# Patient Record
Sex: Female | Born: 1935
Health system: Southern US, Community
[De-identification: ages and names within clinical notes are randomized; demographics above are authoritative.]

## PROBLEM LIST (undated history)

## (undated) DIAGNOSIS — E119 Type 2 diabetes mellitus without complications: Secondary | ICD-10-CM

## (undated) DIAGNOSIS — E039 Hypothyroidism, unspecified: Secondary | ICD-10-CM

## (undated) DIAGNOSIS — I10 Essential (primary) hypertension: Secondary | ICD-10-CM

## (undated) DIAGNOSIS — E785 Hyperlipidemia, unspecified: Secondary | ICD-10-CM

## (undated) HISTORY — PX: TONSILLECTOMY: SUR1361

## (undated) HISTORY — PX: CATARACT EXTRACTION W/ INTRAOCULAR LENS IMPLANT: SHX1309

## (undated) HISTORY — PX: OTHER SURGICAL HISTORY: SHX169

## (undated) HISTORY — DX: Hypothyroidism, unspecified: E03.9

## (undated) HISTORY — DX: Hyperlipidemia, unspecified: E78.5

## (undated) HISTORY — DX: Essential (primary) hypertension: I10

## (undated) HISTORY — PX: ANKLE FRACTURE SURGERY: SHX122

## (undated) HISTORY — PX: THYROIDECTOMY: SHX17

## (undated) HISTORY — PX: BREAST LUMPECTOMY: SHX2

## (undated) HISTORY — DX: Type 2 diabetes mellitus without complications: E11.9

---

## 1996-12-15 HISTORY — PX: CHOLECYSTECTOMY: SHX55

## 1999-01-18 ENCOUNTER — Ambulatory Visit (HOSPITAL_COMMUNITY): Admission: RE | Admit: 1999-01-18 | Discharge: 1999-01-18 | Payer: Self-pay | Admitting: Obstetrics & Gynecology

## 1999-02-08 ENCOUNTER — Ambulatory Visit (HOSPITAL_COMMUNITY): Admission: RE | Admit: 1999-02-08 | Discharge: 1999-02-08 | Payer: Self-pay | Admitting: Unknown Physician Specialty

## 2000-01-10 ENCOUNTER — Ambulatory Visit (HOSPITAL_COMMUNITY): Admission: RE | Admit: 2000-01-10 | Discharge: 2000-01-10 | Payer: Self-pay | Admitting: *Deleted

## 2000-02-11 ENCOUNTER — Ambulatory Visit (HOSPITAL_COMMUNITY): Admission: RE | Admit: 2000-02-11 | Discharge: 2000-02-11 | Payer: Self-pay

## 2001-01-15 ENCOUNTER — Ambulatory Visit (HOSPITAL_COMMUNITY): Admission: RE | Admit: 2001-01-15 | Discharge: 2001-01-15 | Payer: Self-pay | Admitting: *Deleted

## 2001-02-11 ENCOUNTER — Ambulatory Visit (HOSPITAL_COMMUNITY): Admission: RE | Admit: 2001-02-11 | Discharge: 2001-02-11 | Payer: Self-pay | Admitting: Internal Medicine

## 2002-02-18 ENCOUNTER — Ambulatory Visit (HOSPITAL_COMMUNITY): Admission: RE | Admit: 2002-02-18 | Discharge: 2002-02-18 | Payer: Self-pay

## 2002-12-15 DIAGNOSIS — A4902 Methicillin resistant Staphylococcus aureus infection, unspecified site: Secondary | ICD-10-CM

## 2002-12-15 HISTORY — DX: Methicillin resistant Staphylococcus aureus infection, unspecified site: A49.02

## 2003-02-17 ENCOUNTER — Ambulatory Visit (HOSPITAL_COMMUNITY): Admission: RE | Admit: 2003-02-17 | Discharge: 2003-02-17 | Payer: Self-pay | Admitting: Unknown Physician Specialty

## 2003-04-05 ENCOUNTER — Ambulatory Visit (HOSPITAL_COMMUNITY): Admission: RE | Admit: 2003-04-05 | Discharge: 2003-04-05 | Payer: Self-pay | Admitting: Unknown Physician Specialty

## 2004-04-05 ENCOUNTER — Ambulatory Visit (HOSPITAL_COMMUNITY): Admission: RE | Admit: 2004-04-05 | Discharge: 2004-04-05 | Payer: Self-pay | Admitting: Unknown Physician Specialty

## 2005-04-08 ENCOUNTER — Ambulatory Visit: Payer: Self-pay | Admitting: Infectious Diseases

## 2005-04-10 ENCOUNTER — Ambulatory Visit (HOSPITAL_COMMUNITY): Admission: RE | Admit: 2005-04-10 | Discharge: 2005-04-10 | Payer: Self-pay | Admitting: Unknown Physician Specialty

## 2006-04-13 ENCOUNTER — Ambulatory Visit (HOSPITAL_COMMUNITY): Admission: RE | Admit: 2006-04-13 | Discharge: 2006-04-13 | Payer: Self-pay | Admitting: Unknown Physician Specialty

## 2006-05-14 ENCOUNTER — Encounter: Admission: RE | Admit: 2006-05-14 | Discharge: 2006-05-14 | Payer: Self-pay | Admitting: Unknown Physician Specialty

## 2007-05-19 ENCOUNTER — Encounter: Admission: RE | Admit: 2007-05-19 | Discharge: 2007-05-19 | Payer: Self-pay | Admitting: Unknown Physician Specialty

## 2007-10-27 ENCOUNTER — Encounter (INDEPENDENT_AMBULATORY_CARE_PROVIDER_SITE_OTHER): Payer: Self-pay | Admitting: Interventional Radiology

## 2007-10-27 ENCOUNTER — Other Ambulatory Visit: Admission: RE | Admit: 2007-10-27 | Discharge: 2007-10-27 | Payer: Self-pay | Admitting: Interventional Radiology

## 2007-10-27 ENCOUNTER — Encounter: Admission: RE | Admit: 2007-10-27 | Discharge: 2007-10-27 | Payer: Self-pay | Admitting: Endocrinology

## 2008-01-26 ENCOUNTER — Encounter: Admission: RE | Admit: 2008-01-26 | Discharge: 2008-01-26 | Payer: Self-pay | Admitting: Unknown Physician Specialty

## 2008-02-09 ENCOUNTER — Encounter: Admission: RE | Admit: 2008-02-09 | Discharge: 2008-02-09 | Payer: Self-pay | Admitting: Internal Medicine

## 2008-02-16 ENCOUNTER — Encounter: Admission: RE | Admit: 2008-02-16 | Discharge: 2008-02-16 | Payer: Self-pay | Admitting: Internal Medicine

## 2008-02-16 ENCOUNTER — Encounter (INDEPENDENT_AMBULATORY_CARE_PROVIDER_SITE_OTHER): Payer: Self-pay | Admitting: Diagnostic Radiology

## 2008-05-12 ENCOUNTER — Encounter (INDEPENDENT_AMBULATORY_CARE_PROVIDER_SITE_OTHER): Payer: Self-pay | Admitting: Surgery

## 2008-05-12 ENCOUNTER — Encounter: Admission: RE | Admit: 2008-05-12 | Discharge: 2008-05-12 | Payer: Self-pay | Admitting: Surgery

## 2008-05-12 ENCOUNTER — Ambulatory Visit (HOSPITAL_COMMUNITY): Admission: RE | Admit: 2008-05-12 | Discharge: 2008-05-12 | Payer: Self-pay | Admitting: Surgery

## 2008-09-05 ENCOUNTER — Ambulatory Visit: Payer: Self-pay | Admitting: Oncology

## 2008-09-26 LAB — CBC WITH DIFFERENTIAL (CANCER CENTER ONLY)
HGB: 13.3 g/dL (ref 11.6–15.9)
LYMPH#: 3.5 10*3/uL — ABNORMAL HIGH (ref 0.9–3.3)
LYMPH%: 39.3 % (ref 14.0–48.0)
MCH: 29.3 pg (ref 26.0–34.0)
MCV: 86 fL (ref 81–101)
NEUT#: 4.3 10*3/uL (ref 1.5–6.5)
Platelets: 328 10*3/uL (ref 145–400)
RBC: 4.54 10*6/uL (ref 3.70–5.32)
RDW: 12.6 % (ref 10.5–14.6)
WBC: 8.8 10*3/uL (ref 3.9–10.0)

## 2008-09-26 LAB — CMP (CANCER CENTER ONLY)
AST: 28 U/L (ref 11–38)
CO2: 31 mEq/L (ref 18–33)
Calcium: 9.4 mg/dL (ref 8.0–10.3)
Chloride: 101 mEq/L (ref 98–108)
Potassium: 4 mEq/L (ref 3.3–4.7)
Sodium: 143 mEq/L (ref 128–145)
Total Protein: 7.7 g/dL (ref 6.4–8.1)

## 2008-09-27 LAB — VITAMIN D 25 HYDROXY (VIT D DEFICIENCY, FRACTURES): Vit D, 25-Hydroxy: 25 ng/mL — ABNORMAL LOW (ref 30–89)

## 2008-10-25 ENCOUNTER — Ambulatory Visit: Payer: Self-pay | Admitting: Oncology

## 2008-10-30 LAB — CBC WITH DIFFERENTIAL (CANCER CENTER ONLY)
BASO#: 0.2 10*3/uL (ref 0.0–0.2)
BASO%: 1.4 % (ref 0.0–2.0)
EOS%: 2.7 % (ref 0.0–7.0)
Eosinophils Absolute: 0.3 10*3/uL (ref 0.0–0.5)
HCT: 38.6 % (ref 34.8–46.6)
LYMPH%: 38.2 % (ref 14.0–48.0)
MONO#: 0.9 10*3/uL (ref 0.1–0.9)
RBC: 4.48 10*6/uL (ref 3.70–5.32)
RDW: 12 % (ref 10.5–14.6)

## 2008-10-30 LAB — CMP (CANCER CENTER ONLY)
ALT(SGPT): 26 U/L (ref 10–47)
AST: 25 U/L (ref 11–38)
Albumin: 3.8 g/dL (ref 3.3–5.5)
BUN, Bld: 14 mg/dL (ref 7–22)
Chloride: 106 mEq/L (ref 98–108)
Sodium: 140 mEq/L (ref 128–145)
Total Protein: 7.4 g/dL (ref 6.4–8.1)

## 2009-04-26 ENCOUNTER — Ambulatory Visit: Payer: Self-pay | Admitting: Oncology

## 2009-12-15 HISTORY — PX: COLONOSCOPY: SHX174

## 2010-07-11 ENCOUNTER — Other Ambulatory Visit: Admission: RE | Admit: 2010-07-11 | Discharge: 2010-07-11 | Payer: Self-pay | Admitting: Otolaryngology

## 2010-08-21 ENCOUNTER — Encounter (INDEPENDENT_AMBULATORY_CARE_PROVIDER_SITE_OTHER): Payer: Self-pay | Admitting: Otolaryngology

## 2010-08-21 ENCOUNTER — Ambulatory Visit (HOSPITAL_COMMUNITY): Admission: RE | Admit: 2010-08-21 | Discharge: 2010-08-22 | Payer: Self-pay | Admitting: Otolaryngology

## 2011-01-05 ENCOUNTER — Encounter: Payer: Self-pay | Admitting: Internal Medicine

## 2011-01-05 ENCOUNTER — Encounter: Payer: Self-pay | Admitting: Unknown Physician Specialty

## 2011-02-27 LAB — CBC
HCT: 39.3 % (ref 36.0–46.0)
MCH: 29.1 pg (ref 26.0–34.0)
MCHC: 33.6 g/dL (ref 30.0–36.0)
RBC: 4.53 MIL/uL (ref 3.87–5.11)

## 2011-02-27 LAB — GLUCOSE, CAPILLARY
Glucose-Capillary: 117 mg/dL — ABNORMAL HIGH (ref 70–99)
Glucose-Capillary: 144 mg/dL — ABNORMAL HIGH (ref 70–99)

## 2011-02-27 LAB — COMPREHENSIVE METABOLIC PANEL
ALT: 24 U/L (ref 0–35)
AST: 25 U/L (ref 0–37)
Albumin: 4.3 g/dL (ref 3.5–5.2)
Alkaline Phosphatase: 95 U/L (ref 39–117)
Creatinine, Ser: 0.66 mg/dL (ref 0.4–1.2)
GFR calc Af Amer: >60 mL/min (ref >60)
Sodium: 140 mEq/L (ref 135–145)
Total Bilirubin: 1.1 mg/dL (ref 0.3–1.2)
Total Protein: 7.3 g/dL (ref 6.0–8.3)

## 2011-02-27 LAB — SURGICAL PCR SCREEN: MRSA, PCR: NEGATIVE

## 2011-04-29 NOTE — Op Note (Signed)
NAME:  [name], [name]               ACCOUNT NO.:  [id number]   MEDICAL RECORD NO.:  [id number]          PATIENT TYPE:  AMB   LOCATION:  SDS                          FACILITY:  [facility]   PHYSICIAN:  Thomas A. Cornett, M.D.DATE OF BIRTH:  1936/05/07   DATE OF PROCEDURE:  05/12/2008  DATE OF DISCHARGE:                               OPERATIVE REPORT   PREOPERATIVE DIAGNOSIS:  Right breast mass.   POSTOPERATIVE DIAGNOSIS:  Right breast mass.   PROCEDURE:  Right breast needle-localized lumpectomy.   SURGEON:  Maisie Fus A. Cornett, MD   ANESTHESIA:  LMA with 0.25% Sensorcaine.   ESTIMATED BLOOD LOSS:  20 mL.   SPECIMEN:  Right breast tissue with localizing wire and clip, verified  to be adequate by radiography to pathology.   DRAINS:  None.   INDICATIONS FOR PROCEDURE:  The patient is a 75 year old female found to  have an abnormality noted on her most recent mammogram involving her  right breast.  The density was biopsied, proven to be benign, but  unfortunately, this is not corresponded to the images and excisional  biopsy is recommended to further evaluate this.  She presents today for  definitive diagnosis.  Informed consent was obtained.   DESCRIPTION OF PROCEDURE:  The patient was brought to the operating room  after undergoing right breast needle localization by the radiologist.  After induction of LMA anesthesia, the right breast was prepped and  draped in a sterile fashion.  The wire exited the right lower outer  breast.  A curvilinear incision was made around the wire.  We excised  all tissue around the entire length of the wire down to the chest wall  to incorporate the abnormality.  Radiograph revealed this to be  adequate.  Irrigation was used and hemostasis was achieved with cautery.  No evidence of any bleeding at this point.  The wound was closed in  layers with a deep layer of 3-0 Vicryl and 4-0 Monocryl subcuticular  stitch.  Dermabond was applied.  All final counts  of sponge, needle, and  instruments were found be correct at this portion of the case.  The  patient was then awoke and taken to recovery in satisfactory condition.     Thomas A. Cornett, M.D.  Electronically Signed    TAC/MEDQ  D:  05/12/2008  T:  05/13/2008  Job:  [id number]   cc:   Dr. Sherril Croon

## 2011-09-10 LAB — COMPREHENSIVE METABOLIC PANEL
CO2: 29
Calcium: 9.4
Creatinine, Ser: 0.63
GFR calc non Af Amer: >60
Glucose, Bld: 154 — ABNORMAL HIGH
Total Protein: 7.4

## 2011-09-10 LAB — DIFFERENTIAL
Lymphocytes Relative: 38
Lymphs Abs: 4
Monocytes Relative: 10
Neutro Abs: 5.1
Neutrophils Relative %: 48

## 2011-09-10 LAB — CBC
Platelets: 314
RBC: 4.46
WBC: 10.5

## 2013-05-24 ENCOUNTER — Other Ambulatory Visit: Payer: Self-pay | Admitting: Otolaryngology

## 2013-05-24 DIAGNOSIS — D34 Benign neoplasm of thyroid gland: Secondary | ICD-10-CM

## 2013-05-26 ENCOUNTER — Ambulatory Visit
Admission: RE | Admit: 2013-05-26 | Discharge: 2013-05-26 | Disposition: A | Discharge status: Home / Self Care | Payer: Medicare Other | Source: Ambulatory Visit | Attending: Otolaryngology | Admitting: Otolaryngology

## 2013-05-26 DIAGNOSIS — D34 Benign neoplasm of thyroid gland: Secondary | ICD-10-CM

## 2016-03-17 DIAGNOSIS — N894 Leukoplakia of vagina: Secondary | ICD-10-CM | POA: Diagnosis not present

## 2016-03-17 DIAGNOSIS — Z789 Other specified health status: Secondary | ICD-10-CM | POA: Diagnosis not present

## 2016-03-17 DIAGNOSIS — Z299 Encounter for prophylactic measures, unspecified: Secondary | ICD-10-CM | POA: Diagnosis not present

## 2016-06-26 DIAGNOSIS — M65841 Other synovitis and tenosynovitis, right hand: Secondary | ICD-10-CM | POA: Diagnosis not present

## 2016-06-26 DIAGNOSIS — M65842 Other synovitis and tenosynovitis, left hand: Secondary | ICD-10-CM | POA: Diagnosis not present

## 2016-11-18 DIAGNOSIS — M5442 Lumbago with sciatica, left side: Secondary | ICD-10-CM | POA: Diagnosis not present

## 2016-11-18 DIAGNOSIS — M9903 Segmental and somatic dysfunction of lumbar region: Secondary | ICD-10-CM | POA: Diagnosis not present

## 2016-11-18 DIAGNOSIS — M47816 Spondylosis without myelopathy or radiculopathy, lumbar region: Secondary | ICD-10-CM | POA: Diagnosis not present

## 2016-12-05 DIAGNOSIS — E039 Hypothyroidism, unspecified: Secondary | ICD-10-CM | POA: Diagnosis not present

## 2016-12-05 DIAGNOSIS — Z1211 Encounter for screening for malignant neoplasm of colon: Secondary | ICD-10-CM | POA: Diagnosis not present

## 2016-12-05 DIAGNOSIS — E119 Type 2 diabetes mellitus without complications: Secondary | ICD-10-CM | POA: Diagnosis not present

## 2016-12-05 DIAGNOSIS — Z1389 Encounter for screening for other disorder: Secondary | ICD-10-CM | POA: Diagnosis not present

## 2016-12-05 DIAGNOSIS — Z7189 Other specified counseling: Secondary | ICD-10-CM | POA: Diagnosis not present

## 2016-12-05 DIAGNOSIS — Z299 Encounter for prophylactic measures, unspecified: Secondary | ICD-10-CM | POA: Diagnosis not present

## 2016-12-05 DIAGNOSIS — Z0000 Encounter for general adult medical examination without abnormal findings: Secondary | ICD-10-CM | POA: Diagnosis not present

## 2016-12-05 DIAGNOSIS — Z79899 Other long term (current) drug therapy: Secondary | ICD-10-CM | POA: Diagnosis not present

## 2016-12-05 DIAGNOSIS — R5383 Other fatigue: Secondary | ICD-10-CM | POA: Diagnosis not present

## 2017-01-13 ENCOUNTER — Encounter: Payer: Self-pay | Admitting: Gastroenterology

## 2017-01-29 DIAGNOSIS — M5442 Lumbago with sciatica, left side: Secondary | ICD-10-CM | POA: Diagnosis not present

## 2017-01-29 DIAGNOSIS — M9903 Segmental and somatic dysfunction of lumbar region: Secondary | ICD-10-CM | POA: Diagnosis not present

## 2017-01-29 DIAGNOSIS — M47816 Spondylosis without myelopathy or radiculopathy, lumbar region: Secondary | ICD-10-CM | POA: Diagnosis not present

## 2017-02-02 DIAGNOSIS — M9903 Segmental and somatic dysfunction of lumbar region: Secondary | ICD-10-CM | POA: Diagnosis not present

## 2017-02-02 DIAGNOSIS — M47816 Spondylosis without myelopathy or radiculopathy, lumbar region: Secondary | ICD-10-CM | POA: Diagnosis not present

## 2017-02-02 DIAGNOSIS — M5442 Lumbago with sciatica, left side: Secondary | ICD-10-CM | POA: Diagnosis not present

## 2017-02-03 ENCOUNTER — Ambulatory Visit (INDEPENDENT_AMBULATORY_CARE_PROVIDER_SITE_OTHER): Payer: Medicare Other | Admitting: Gastroenterology

## 2017-02-03 ENCOUNTER — Other Ambulatory Visit: Payer: Self-pay

## 2017-02-03 ENCOUNTER — Encounter: Payer: Self-pay | Admitting: Gastroenterology

## 2017-02-03 ENCOUNTER — Telehealth: Payer: Self-pay

## 2017-02-03 DIAGNOSIS — R195 Other fecal abnormalities: Secondary | ICD-10-CM

## 2017-02-03 MED ORDER — PEG 3350-KCL-NA BICARB-NACL 420 G PO SOLR: 4000.0000 mL | ORAL | 0 refills | Status: DC

## 2017-02-03 NOTE — Progress Notes (Signed)
CC'D TO PCP °

## 2017-02-03 NOTE — Patient Instructions (Signed)
1. Colonoscopy as scheduled. Please see separate instructions. 

## 2017-02-03 NOTE — Progress Notes (Addendum)
REVIEWED-NO ADDITIONAL RECOMMENDATIONS.  Primary Care Physician:  Glenda Chroman, MD  Primary Gastroenterologist:  Barney Drain, MD   Chief Complaint  Patient presents with  . other    Heme pos stool    HPI:  [name] is a 81 y.o. female here At the request of her PCP for further evaluation of Hemoccult-positive stool. Patient has a history of remote colonoscopy around 2011 according to PCP records. She had polyps and diverticulosis. Records unavailable to me. Recently she noted that she was having increasing drowsiness. She will follow sleeping she stopped. Sleeping up to 18 hours a day. When she was awake she felt fine with plenty of energy. This prompted visit to see her PCP. Describes herself as a very active individual and this is completely normal for her. Denies dyspnea on exertion, chest pain, appetite concerns, bowel issues, abdominal pain, vomiting, anorexia, unintentional weight loss no heartburn dysphagia. As part of her workup she was heme positive. Her hemoglobin was 13. Creatinine normal. Electrolytes normal. She is a diet-controlled diabetic.   Patient states that she feels fine, she just can't stay awake.  Current Outpatient Prescriptions  Medication Sig Dispense Refill  . levothyroxine (SYNTHROID, LEVOTHROID) 75 MCG tablet Take 75 mcg by mouth daily before breakfast.    . NON FORMULARY Vitamin D3  50000 units weekly     No current facility-administered medications for this visit.     Allergies as of 02/03/2017 - Review Complete 02/03/2017  Allergen Reaction Noted  . Motrin [ibuprofen] Shortness Of Breath and Swelling 02/03/2017  . Penicillins Anaphylaxis 02/03/2017  . Ciprofloxacin Rash 02/03/2017  . Naproxen Itching and Rash 02/03/2017  . Septra [sulfamethoxazole-trimethoprim] Rash 02/03/2017    Past Medical History:  Diagnosis Date  . Diabetes ([place])    diet controlled  . Hyperlipidemia   . Hypertension   . Hypothyroidism     Past Surgical History:   Procedure Laterality Date  . ANKLE FRACTURE SURGERY     right  . bladder tack    . BREAST LUMPECTOMY     benign  . carpel tunnel     bilateral  . CATARACT EXTRACTION W/ INTRAOCULAR LENS IMPLANT    . CHOLECYSTECTOMY  1998  . COLONOSCOPY  2011   According to PCP office note, history of polyps and diverticulosis  . THYROIDECTOMY    . TONSILLECTOMY      Family History  Problem Relation Age of Onset  . Leukemia Father   . Myasthenia gravis Father   . Breast cancer Maternal Grandmother   . Lung cancer Paternal Grandfather   . Lung cancer Paternal Aunt   . Cancer - Lung Paternal Aunt   . Uterine cancer Maternal Aunt   . Pancreatic cancer Maternal Aunt   . Colon cancer Neg Hx     Social History   Social History  . Marital status: Divorced    Spouse name: N/A  . Number of children: N/A  . Years of education: N/A   Occupational History  . Not on file.   Social History Main Topics  . Smoking status: Former Research scientist (life sciences)  . Smokeless tobacco: Never Used     Comment: Quit x 50 years  . Alcohol use Not on file  . Drug use: Unknown  . Sexual activity: Not on file   Other Topics Concern  . Not on file   Social History Narrative  . No narrative on file      ROS:  General: Negative for anorexia, weight loss,  fever, chills, weakness.See history of present illness Eyes: Negative for vision changes.  ENT: Negative for hoarseness, difficulty swallowing , nasal congestion. CV: Negative for chest pain, angina, palpitations, dyspnea on exertion, peripheral edema.  Respiratory: Negative for dyspnea at rest, dyspnea on exertion, cough, sputum, wheezing.  GI: See history of present illness. GU:  Negative for dysuria, hematuria, urinary incontinence, urinary frequency, nocturnal urination.  MS: Negative for joint pain, low back pain.  Derm: Negative for rash or itching.  Neuro: Negative for weakness, abnormal sensation, seizure, frequent headaches, memory loss, confusion.  Psych:  Negative for anxiety, depression, suicidal ideation, hallucinations.  Endo: Negative for unusual weight change.  Heme: Negative for bruising or bleeding. Allergy: Negative for rash or hives.    Physical Examination:  BP (!) 155/76   Pulse 74   Temp 98.1 F (36.7 C) (Oral)   Ht 5\' 2"  (1.575 m)   Wt 169 lb 12.8 oz (77 kg)   BMI 31.06 kg/m    General: Well-nourished, well-developed in no acute distress.  Head: Normocephalic, atraumatic.   Eyes: Conjunctiva pink, no icterus. Mouth: Oropharyngeal mucosa moist and pink , no lesions erythema or exudate. Neck: Supple without thyromegaly, masses, or lymphadenopathy.  Lungs: Clear to auscultation bilaterally.  Heart: Regular rate and rhythm, no murmurs rubs or gallops.  Abdomen: Bowel sounds are normal, nontender, nondistended, no hepatosplenomegaly or masses, no abdominal bruits or    hernia , no rebound or guarding.   Rectal: Not performed Extremities: No lower extremity edema. No clubbing or deformities.  Neuro: Alert and oriented x 4 , grossly normal neurologically.  Skin: Warm and dry, no rash or jaundice.   Psych: Alert and cooperative, normal mood and affect.  Labs: Labs from 12/07/2016: TSH 4.13, BUN 10, creatinine 0.62, glucose 131, total bilirubin 0.9, alkaline phosphatase 83, AST 32, ALT 37, albumin 4.4, white blood cell count 10,300, hemoglobin 13, hematocrit 38.1, MCV 87, platelets 378,000  Imaging Studies: No results found.

## 2017-02-03 NOTE — Assessment & Plan Note (Signed)
81 year old female with heme positive stools presenting for further workup per PCP. Patient recently with new onset fatigue without other symptoms. Hemoglobin normal 2 months ago. Last colonoscopy 2011. No upper GI symptoms. Offered colonoscopy for further evaluation of heme positive stool. I have discussed the risks, alternatives, benefits with regards to but not limited to the risk of reaction to medication, bleeding, infection, perforation and the patient is agreeable to proceed. Written consent to be obtained.

## 2017-02-03 NOTE — Telephone Encounter (Signed)
Called pt and scheduled colonoscopy with [facility] for 02/13/17 at 2:30pm, arrive at 1:30pm. Instructions given on phone and mailed. Rx sent for prep.

## 2017-02-05 DIAGNOSIS — M9903 Segmental and somatic dysfunction of lumbar region: Secondary | ICD-10-CM | POA: Diagnosis not present

## 2017-02-05 DIAGNOSIS — M5442 Lumbago with sciatica, left side: Secondary | ICD-10-CM | POA: Diagnosis not present

## 2017-02-05 DIAGNOSIS — M47816 Spondylosis without myelopathy or radiculopathy, lumbar region: Secondary | ICD-10-CM | POA: Diagnosis not present

## 2017-02-09 DIAGNOSIS — M9903 Segmental and somatic dysfunction of lumbar region: Secondary | ICD-10-CM | POA: Diagnosis not present

## 2017-02-09 DIAGNOSIS — M5442 Lumbago with sciatica, left side: Secondary | ICD-10-CM | POA: Diagnosis not present

## 2017-02-09 DIAGNOSIS — M47816 Spondylosis without myelopathy or radiculopathy, lumbar region: Secondary | ICD-10-CM | POA: Diagnosis not present

## 2017-02-13 ENCOUNTER — Ambulatory Visit (HOSPITAL_COMMUNITY)
Admission: RE | Admit: 2017-02-13 | Discharge: 2017-02-13 | Disposition: A | Discharge status: Home / Self Care | Payer: Medicare Other | Source: Ambulatory Visit | Attending: Gastroenterology | Admitting: Gastroenterology

## 2017-02-13 ENCOUNTER — Encounter (HOSPITAL_COMMUNITY)
Admission: RE | Disposition: A | Discharge status: Home / Self Care | Payer: Self-pay | Source: Ambulatory Visit | Attending: Gastroenterology

## 2017-02-13 ENCOUNTER — Encounter (HOSPITAL_COMMUNITY): Payer: Self-pay | Admitting: *Deleted

## 2017-02-13 DIAGNOSIS — E119 Type 2 diabetes mellitus without complications: Secondary | ICD-10-CM | POA: Diagnosis not present

## 2017-02-13 DIAGNOSIS — E785 Hyperlipidemia, unspecified: Secondary | ICD-10-CM | POA: Insufficient documentation

## 2017-02-13 DIAGNOSIS — E039 Hypothyroidism, unspecified: Secondary | ICD-10-CM | POA: Diagnosis not present

## 2017-02-13 DIAGNOSIS — K644 Residual hemorrhoidal skin tags: Secondary | ICD-10-CM | POA: Insufficient documentation

## 2017-02-13 DIAGNOSIS — K5730 Diverticulosis of large intestine without perforation or abscess without bleeding: Secondary | ICD-10-CM

## 2017-02-13 DIAGNOSIS — R195 Other fecal abnormalities: Secondary | ICD-10-CM | POA: Diagnosis not present

## 2017-02-13 DIAGNOSIS — Z79899 Other long term (current) drug therapy: Secondary | ICD-10-CM | POA: Diagnosis not present

## 2017-02-13 DIAGNOSIS — K648 Other hemorrhoids: Secondary | ICD-10-CM | POA: Diagnosis not present

## 2017-02-13 DIAGNOSIS — I10 Essential (primary) hypertension: Secondary | ICD-10-CM | POA: Diagnosis not present

## 2017-02-13 DIAGNOSIS — Z87891 Personal history of nicotine dependence: Secondary | ICD-10-CM | POA: Insufficient documentation

## 2017-02-13 DIAGNOSIS — K5520 Angiodysplasia of colon without hemorrhage: Secondary | ICD-10-CM

## 2017-02-13 HISTORY — PX: COLONOSCOPY: SHX5424

## 2017-02-13 LAB — GLUCOSE, CAPILLARY: GLUCOSE-CAPILLARY: 115 mg/dL — AB (ref 65–99)

## 2017-02-13 SURGERY — COLONOSCOPY
Anesthesia: Moderate Sedation

## 2017-02-13 MED ORDER — STERILE WATER FOR IRRIGATION IR SOLN
Status: DC | PRN
  Administered 2017-02-13: 11:00:00

## 2017-02-13 MED ORDER — MEPERIDINE HCL 100 MG/ML IJ SOLN
INTRAMUSCULAR | Status: AC
  Filled 2017-02-13: qty 2

## 2017-02-13 MED ORDER — MEPERIDINE HCL 100 MG/ML IJ SOLN
INTRAMUSCULAR | Status: DC | PRN
  Administered 2017-02-13 (×2): 25 mg via INTRAVENOUS

## 2017-02-13 MED ORDER — MIDAZOLAM HCL 5 MG/5ML IJ SOLN
INTRAMUSCULAR | Status: AC
  Filled 2017-02-13: qty 10

## 2017-02-13 MED ORDER — SODIUM CHLORIDE 0.9 % IV SOLN
INTRAVENOUS | Status: DC
  Administered 2017-02-13: 10:00:00 via INTRAVENOUS

## 2017-02-13 MED ORDER — MIDAZOLAM HCL 5 MG/5ML IJ SOLN
INTRAMUSCULAR | Status: DC | PRN
  Administered 2017-02-13 (×2): 2 mg via INTRAVENOUS

## 2017-02-13 NOTE — Discharge Instructions (Signed)
The blood detected IN YOUR STOOL IS DUE TO AVMS AND HEMORRHOIDS. I CAUTERIZED ONE AVM AND MADE IT DISAPPEAR. You DID NOT HAVE ANY POLYPS. YOU HAVE DIVERTICULOSIS IN YOUR LEFT COLON. You ALSO have external hemorrhoids.     DRINK WATER TO KEEP URINE LIGHT YELLOW.  FOLLOW A HIGH FIBER DIET. AVOID ITEMS THAT CAUSE BLOATING & GAS. SEE INFO BELOW.  FOLLOW UP IN 4 MOS.         ENDOSCOPY Care After Read the instructions outlined below and refer to this sheet in the next week. These discharge instructions provide you with general information on caring for yourself after you leave the hospital. While your treatment has been planned according to the most current medical practices available, unavoidable complications occasionally occur. If you have any problems or questions after discharge, call DR. Makenze Ellett, [phone].  ACTIVITY  You may resume your regular activity, but move at a slower pace for the next 24 hours.   Take frequent rest periods for the next 24 hours.   Walking will help get rid of the air and reduce the bloated feeling in your belly (abdomen).   No driving for 24 hours (because of the medicine (anesthesia) used during the test).   You may shower.   Do not sign any important legal documents or operate any machinery for 24 hours (because of the anesthesia used during the test).    NUTRITION  Drink plenty of fluids.   You may resume your normal diet as instructed by your doctor.   Begin with a light meal and progress to your normal diet. Heavy or fried foods are harder to digest and may make you feel sick to your stomach (nauseated).   Avoid alcoholic beverages for 24 hours or as instructed.    MEDICATIONS  You may resume your normal medications.   WHAT YOU CAN EXPECT TODAY  Some feelings of bloating in the abdomen.   Passage of more gas than usual.   Spotting of blood in your stool or on the toilet paper  .  IF YOU HAD POLYPS REMOVED DURING THE  COLONOSCOPY:  Eat a soft diet IF YOU HAVE NAUSEA, BLOATING, ABDOMINAL PAIN, OR VOMITING.    FINDING OUT THE RESULTS OF YOUR TEST Not all test results are available during your visit. DR. Oneida Alar WILL CALL YOU WITHIN 7 DAYS OF YOUR PROCEDUE WITH YOUR RESULTS. Do not assume everything is normal if you have not heard from DR. Whitt Auletta IN ONE WEEK, CALL HER OFFICE AT [phone].  SEEK IMMEDIATE MEDICAL ATTENTION AND CALL THE OFFICE: [phone] IF:  You have more than a spotting of blood in your stool.   Your belly is swollen (abdominal distention).   You are nauseated or vomiting.   You have a temperature over 101F.   You have abdominal pain or discomfort that is severe or gets worse throughout the day.  Arteriovenous Malformation An arteriovenous malformation (AVM) is a disorder that has been present since birth (congenital). It is characterized by a complex, tangled web of arteries and veins. An AVM may occur in the STOMACH, COLON, OR SMALL BOWEL.  SYMPTOMS  The most common problems (symptoms) of AVM include:  Bleeding (hemorrhaging).   ANEMIA  TREATMENT  The treatment for AVMs: **ABLATION WITH HEAT   High-Fiber Diet A high-fiber diet changes your normal diet to include more whole grains, legumes, fruits, and vegetables. Changes in the diet involve replacing refined carbohydrates with unrefined foods. The calorie level of the diet is essentially  unchanged. The Dietary Reference Intake (recommended amount) for adult males is 38 grams per day. For adult females, it is 25 grams per day. Pregnant and lactating women should consume 28 grams of fiber per day. Fiber is the intact part of a plant that is not broken down during digestion. Functional fiber is fiber that has been isolated from the plant to provide a beneficial effect in the body. PURPOSE  Increase stool bulk.   Ease and regulate bowel movements.   Lower cholesterol.   REDUCE RISK OF COLON CANCER  INDICATIONS THAT  YOU NEED MORE FIBER  Constipation and hemorrhoids.   Uncomplicated diverticulosis (intestine condition) and irritable bowel syndrome.   Weight management.   As a protective measure against hardening of the arteries (atherosclerosis), diabetes, and cancer.   GUIDELINES FOR INCREASING FIBER IN THE DIET  Start adding fiber to the diet slowly. A gradual increase of about 5 more grams (2 slices of whole-wheat bread, 2 servings of most fruits or vegetables, or 1 bowl of high-fiber cereal) per day is best. Too rapid an increase in fiber may result in constipation, flatulence, and bloating.   Drink enough water and fluids to keep your urine clear or pale yellow. Water, juice, or caffeine-free drinks are recommended. Not drinking enough fluid may cause constipation.   Eat a variety of high-fiber foods rather than one type of fiber.   Try to increase your intake of fiber through using high-fiber foods rather than fiber pills or supplements that contain small amounts of fiber.   The goal is to change the types of food eaten. Do not supplement your present diet with high-fiber foods, but replace foods in your present diet.   INCLUDE A VARIETY OF FIBER SOURCES  Replace refined and processed grains with whole grains, canned fruits with fresh fruits, and incorporate other fiber sources. White rice, white breads, and most bakery goods contain little or no fiber.   Brown whole-grain rice, buckwheat oats, and many fruits and vegetables are all good sources of fiber. These include: broccoli, Brussels sprouts, cabbage, cauliflower, beets, sweet potatoes, white potatoes (skin on), carrots, tomatoes, eggplant, squash, berries, fresh fruits, and dried fruits.   Cereals appear to be the richest source of fiber. Cereal fiber is found in whole grains and bran. Bran is the fiber-rich outer coat of cereal grain, which is largely removed in refining. In whole-grain cereals, the bran remains. In breakfast cereals, the  largest amount of fiber is found in those with "bran" in their names. The fiber content is sometimes indicated on the label.   You may need to include additional fruits and vegetables each day.   In baking, for 1 cup white flour, you may use the following substitutions:   1 cup whole-wheat flour minus 2 tablespoons.   1/2 cup white flour plus 1/2 cup whole-wheat flour.   Diverticulosis Diverticulosis is a common condition that develops when small pouches (diverticula) form in the wall of the colon. The risk of diverticulosis increases with age. It happens more often in people who eat a low-fiber diet. Most individuals with diverticulosis have no symptoms. Those individuals with symptoms usually experience belly (abdominal) pain, constipation, or loose stools (diarrhea).  HOME CARE INSTRUCTIONS  Increase the amount of fiber in your diet as directed by your caregiver or dietician. This may reduce symptoms of diverticulosis.   Drink at least 6 to 8 glasses of water each day to prevent constipation.   Try not to strain when you have a  bowel movement.   THERE IS NO NEED TO Avoid nuts and seeds to prevent complications.   FOODS HAVING HIGH FIBER CONTENT INCLUDE:  Fruits. Apple, peach, pear, tangerine, raisins, prunes.   Vegetables. Brussels sprouts, asparagus, broccoli, cabbage, carrot, cauliflower, romaine lettuce, spinach, summer squash, tomato, winter squash, zucchini.   Starchy Vegetables. Baked beans, kidney beans, lima beans, split peas, lentils, potatoes (with skin).   Grains. Whole wheat bread, brown rice, bran flake cereal, plain oatmeal, white rice, shredded wheat, bran muffins.

## 2017-02-13 NOTE — Op Note (Signed)
[facility] Patient Name: [name] Procedure Date: 02/13/2017 11:02 AM MRN: [id number] Date of Birth: 1936/09/13 Attending MD: Barney Drain , MD CSN: [id number] Age: 81 Admit Type: Outpatient Procedure:                Colonoscopy WITH APC Indications:              Heme positive stool Providers:                Barney Drain, MD, Janeece Riggers, RN, Purcell Nails. [place],                            Merchant navy officer Referring MD:             Glenda Chroman Medicines:                Meperidine 50 mg IV, Midazolam 4 mg IV Complications:            No immediate complications. Estimated Blood Loss:     Estimated blood loss was minimal. Procedure:                Pre-Anesthesia Assessment:                           - Prior to the procedure, a History and Physical                            was performed, and patient medications and                            allergies were reviewed. The patient's tolerance of                            previous anesthesia was also reviewed. The risks                            and benefits of the procedure and the sedation                            options and risks were discussed with the patient.                            All questions were answered, and informed consent                            was obtained. Prior Anticoagulants: The patient has                            taken no previous anticoagulant or antiplatelet                            agents. ASA Grade Assessment: II - A patient with                            mild systemic disease. After reviewing the risks  and benefits, the patient was deemed in                            satisfactory condition to undergo the                            procedure.After obtaining informed consent, the                            colonoscope was passed under direct vision.                            Throughout the procedure, the patient's blood                            pressure, pulse, and  oxygen saturations were                            monitored continuously. The [id number]                            scope was introduced through the anus and advanced                            to the 5 cm into the ileum. The colonoscopy was                            somewhat difficult due to a tortuous colon.                            Successful completion of the procedure was aided by                            COLOWRAP. The patient tolerated the procedure well.                            The quality of the bowel preparation was excellent.                            The terminal ileum, ileocecal valve, appendiceal                            orifice, and rectum were photographed. Scope In: 11:19:13 AM Scope Out: 11:37:11 AM Scope Withdrawal Time: 0 hours 14 minutes 40 seconds  Total Procedure Duration: 0 hours 17 minutes 58 seconds  Findings:      The terminal ileum appeared normal.      Multiple small and large-mouthed diverticula were found in the       recto-sigmoid colon and sigmoid colon.      A single small angioectasia without bleeding was found in the cecum.       Coagulation for bleeding prevention using argon plasma at 0.8       liters/minute and 20 watts was successful.      External and internal hemorrhoids were found during retroflexion. Impression:               -  The examined portion of the ileum was normal.                           - Diverticulosis in the recto-sigmoid colon and in                            the sigmoid colon.                           - HEME POSITIVE STOOLS DUE TO HEMORRHOIDS AND AVMs                           - External and internal hemorrhoids. Moderate Sedation:      Moderate (conscious) sedation was administered by the endoscopy nurse       and supervised by the endoscopist. The following parameters were       monitored: oxygen saturation, heart rate, blood pressure, and response       to care. Total physician intraservice time was 30  minutes. Recommendation:           - High fiber diet.                           - Continue present medications.                           - Repeat colonoscopy 10-15 YEARS IF THE BENEFITS                            OUTWEIGH THE RISKS for surveillance. PT HAS HAD AGE                            APPROPRIATE COLON CANCER SCREENING. NO NEED TO HEME                            TEST STOOLS.                           - Return to GI office in 4 months.                           - Patient has a contact number available for                            emergencies. The signs and symptoms of potential                            delayed complications were discussed with the                            patient. Return to normal activities tomorrow.                            Written discharge instructions were provided to the  patient. Procedure Code(s):        --- Professional ---                           [phone], Colonoscopy, flexible; with control of                            bleeding, any method                           99152, Moderate sedation services provided by the                            same physician or other qualified health care                            professional performing the diagnostic or                            therapeutic service that the sedation supports,                            requiring the presence of an independent trained                            observer to assist in the monitoring of the                            patient's level of consciousness and physiological                            status; initial 15 minutes of intraservice time,                            patient age 75 years or older                           [phone], Moderate sedation services; each additional                            15 minutes intraservice time Diagnosis Code(s):        --- Professional ---                           K64.8, Other hemorrhoids                            K55.20, Angiodysplasia of colon without hemorrhage                           R19.5, Other fecal abnormalities                           K57.30, Diverticulosis of large intestine without  perforation or abscess without bleeding CPT copyright 2016 [employer]. All rights reserved. The codes documented in this report are preliminary and upon coder review may  be revised to meet current compliance requirements. Barney Drain, MD Barney Drain, MD 02/13/2017 11:54:17 AM This report has been signed electronically. Number of Addenda: 0

## 2017-02-13 NOTE — H&P (Signed)
Primary Care Physician:  Glenda Chroman, MD Primary Gastroenterologist:  Dr. Oneida Alar  Pre-Procedure History & Physical: HPI:  [name] is a 81 y.o. female here for [place].  Past Medical History:  Diagnosis Date  . Diabetes ([place])    diet controlled  . Hyperlipidemia   . Hypertension   . Hypothyroidism     Past Surgical History:  Procedure Laterality Date  . ANKLE FRACTURE SURGERY     right  . bladder tack    . BREAST LUMPECTOMY     benign  . carpel tunnel     bilateral  . CATARACT EXTRACTION W/ INTRAOCULAR LENS IMPLANT    . CHOLECYSTECTOMY  1998  . COLONOSCOPY  2011   According to PCP office note, history of polyps and diverticulosis  . THYROIDECTOMY    . TONSILLECTOMY      Prior to Admission medications   Medication Sig Start Date End Date Taking? Authorizing Provider  Ascorbic Acid (VITAMIN C) 1000 MG tablet Take 1,000 mg by mouth every evening.   Yes Historical Provider, MD  levothyroxine (SYNTHROID, LEVOTHROID) 75 MCG tablet Take 75 mcg by mouth every evening.    Yes Historical Provider, MD  Multiple Vitamin (MULTIVITAMIN WITH MINERALS) TABS tablet Take 1 tablet by mouth every evening.   Yes Historical Provider, MD  polyethylene glycol-electrolytes (TRILYTE) 420 g solution Take 4,000 mLs by mouth as directed. 02/03/17  Yes Danie Binder, MD  Vitamin D, Ergocalciferol, (DRISDOL) 50000 units CAPS capsule Take 50,000 Units by mouth every Saturday. 02/05/17  Yes Historical Provider, MD    Allergies as of 02/03/2017 - Review Complete 02/03/2017  Allergen Reaction Noted  . Motrin [ibuprofen] Shortness Of Breath and Swelling 02/03/2017  . Penicillins Anaphylaxis 02/03/2017  . Ciprofloxacin Rash 02/03/2017  . Naproxen Itching and Rash 02/03/2017  . Septra [sulfamethoxazole-trimethoprim] Rash 02/03/2017    Family History  Problem Relation Age of Onset  . Leukemia Father   . Myasthenia gravis Father   . Breast cancer Maternal Grandmother   . Lung  cancer Paternal Grandfather   . Lung cancer Paternal Aunt   . Cancer - Lung Paternal Aunt   . Uterine cancer Maternal Aunt   . Pancreatic cancer Maternal Aunt   . Heart attack Mother   . Colon cancer Neg Hx     Social History   Social History  . Marital status: Divorced    Spouse name: N/A  . Number of children: N/A  . Years of education: N/A   Occupational History  . Not on file.   Social History Main Topics  . Smoking status: Former Research scientist (life sciences)  . Smokeless tobacco: Never Used     Comment: Quit x 50 years  . Alcohol use No  . Drug use: No  . Sexual activity: Not on file   Other Topics Concern  . Not on file   Social History Narrative  . No narrative on file    Review of Systems: See HPI, otherwise negative ROS   Physical Exam: BP (!) 182/86   Pulse 79   Temp 97.9 F (36.6 C) (Oral)   Resp 15   Ht 5\' 2"  (1.575 m)   Wt 165 lb (74.8 kg)   SpO2 95%   BMI 30.18 kg/m  General:   Alert,  pleasant and cooperative in NAD Head:  Normocephalic and atraumatic. Neck:  Supple; Lungs:  Clear throughout to auscultation.    Heart:  Regular rate and rhythm. Abdomen:  Soft, nontender and  nondistended. Normal bowel sounds, without guarding, and without rebound.   Neurologic:  Alert and  oriented x4;  grossly normal neurologically.  Impression/Plan:     HEME POS STOOLS  PLAN:  1.TCS TODAY. DISCUSSED PROCEDURE, BENEFITS, & RISKS: < 1% chance of medication reaction, bleeding, perforation, or rupture of spleen/liver.

## 2017-02-20 ENCOUNTER — Encounter (HOSPITAL_COMMUNITY): Payer: Self-pay | Admitting: Gastroenterology

## 2017-03-11 DIAGNOSIS — Z2821 Immunization not carried out because of patient refusal: Secondary | ICD-10-CM | POA: Diagnosis not present

## 2017-03-11 DIAGNOSIS — E119 Type 2 diabetes mellitus without complications: Secondary | ICD-10-CM | POA: Diagnosis not present

## 2017-03-11 DIAGNOSIS — Z6831 Body mass index (BMI) 31.0-31.9, adult: Secondary | ICD-10-CM | POA: Diagnosis not present

## 2017-03-11 DIAGNOSIS — Z299 Encounter for prophylactic measures, unspecified: Secondary | ICD-10-CM | POA: Diagnosis not present

## 2017-03-11 DIAGNOSIS — J329 Chronic sinusitis, unspecified: Secondary | ICD-10-CM | POA: Diagnosis not present

## 2017-03-11 DIAGNOSIS — Z713 Dietary counseling and surveillance: Secondary | ICD-10-CM | POA: Diagnosis not present

## 2017-05-18 ENCOUNTER — Encounter: Payer: Self-pay | Admitting: Gastroenterology

## 2017-06-15 DIAGNOSIS — Z1231 Encounter for screening mammogram for malignant neoplasm of breast: Secondary | ICD-10-CM | POA: Diagnosis not present

## 2017-06-15 DIAGNOSIS — E7800 Pure hypercholesterolemia, unspecified: Secondary | ICD-10-CM | POA: Diagnosis not present

## 2017-06-15 DIAGNOSIS — E559 Vitamin D deficiency, unspecified: Secondary | ICD-10-CM | POA: Diagnosis not present

## 2017-06-15 DIAGNOSIS — E1165 Type 2 diabetes mellitus with hyperglycemia: Secondary | ICD-10-CM | POA: Diagnosis not present

## 2017-06-15 DIAGNOSIS — Z789 Other specified health status: Secondary | ICD-10-CM | POA: Diagnosis not present

## 2017-06-15 DIAGNOSIS — E039 Hypothyroidism, unspecified: Secondary | ICD-10-CM | POA: Diagnosis not present

## 2017-06-15 DIAGNOSIS — I10 Essential (primary) hypertension: Secondary | ICD-10-CM | POA: Diagnosis not present

## 2017-06-15 DIAGNOSIS — E2839 Other primary ovarian failure: Secondary | ICD-10-CM | POA: Diagnosis not present

## 2017-06-15 DIAGNOSIS — E041 Nontoxic single thyroid nodule: Secondary | ICD-10-CM | POA: Diagnosis not present

## 2017-06-15 DIAGNOSIS — Z299 Encounter for prophylactic measures, unspecified: Secondary | ICD-10-CM | POA: Diagnosis not present

## 2017-06-15 DIAGNOSIS — I6529 Occlusion and stenosis of unspecified carotid artery: Secondary | ICD-10-CM | POA: Diagnosis not present

## 2017-06-15 DIAGNOSIS — Z6831 Body mass index (BMI) 31.0-31.9, adult: Secondary | ICD-10-CM | POA: Diagnosis not present

## 2017-06-18 DIAGNOSIS — Z1231 Encounter for screening mammogram for malignant neoplasm of breast: Secondary | ICD-10-CM | POA: Diagnosis not present

## 2017-07-30 DIAGNOSIS — E2839 Other primary ovarian failure: Secondary | ICD-10-CM | POA: Diagnosis not present

## 2017-08-12 ENCOUNTER — Encounter: Payer: Self-pay | Admitting: Gastroenterology

## 2017-08-12 ENCOUNTER — Ambulatory Visit (INDEPENDENT_AMBULATORY_CARE_PROVIDER_SITE_OTHER): Payer: Medicare Other | Admitting: Gastroenterology

## 2017-08-12 VITALS — BP 160/98 | HR 65 | Temp 97.8°F | Ht 62.0 in | Wt 167.6 lb

## 2017-08-12 DIAGNOSIS — R195 Other fecal abnormalities: Secondary | ICD-10-CM | POA: Diagnosis not present

## 2017-08-12 NOTE — Progress Notes (Signed)
       Primary Care Physician: Glenda Chroman, MD  Primary Gastroenterologist:  Barney Drain, MD   Chief Complaint  Patient presents with  . Blood In Stools    f/u, doing ok    HPI: [name] is a 81 y.o. female hereFor follow-up. She was seen in the office back in February for heme positive stools. She underwent an updated colonoscopy in March. Terminal ileum was normal, multiple diverticula, single small angiectasia without bleeding in the cecum status post APC, external and internal hemorrhoids seen.  She feels well. No GI symptoms. Her excessive sleeping resolved. Blood pressure was up today. She states this usually happens in a doctor's office. She checks her blood pressure routinely at home with normal values.  Current Outpatient Prescriptions  Medication Sig Dispense Refill  . cholecalciferol (VITAMIN D) 1000 units tablet Take 1,000 Units by mouth as needed.    Marland Kitchen levothyroxine (SYNTHROID, LEVOTHROID) 75 MCG tablet Take 75 mcg by mouth every evening.     . Vitamin D, Ergocalciferol, (DRISDOL) 50000 units CAPS capsule Take 50,000 Units by mouth every Saturday.  12   No current facility-administered medications for this visit.     Allergies as of 08/12/2017 - Review Complete 08/12/2017  Allergen Reaction Noted  . Motrin [ibuprofen] Shortness Of Breath and Swelling 02/03/2017  . Penicillins Anaphylaxis 02/03/2017  . Ciprofloxacin Itching and Rash 02/03/2017  . Naproxen Itching and Rash 02/03/2017  . Septra [sulfamethoxazole-trimethoprim] Itching and Rash 02/03/2017  . Acetaminophen Other (See Comments)     ROS:  General: Negative for anorexia, weight loss, fever, chills, fatigue, weakness. ENT: Negative for hoarseness, difficulty swallowing , nasal congestion. CV: Negative for chest pain, angina, palpitations, dyspnea on exertion, peripheral edema.  Respiratory: Negative for dyspnea at rest, dyspnea on exertion, cough, sputum, wheezing.  GI: See history of  present illness. GU:  Negative for dysuria, hematuria, urinary incontinence, urinary frequency, nocturnal urination.  Endo: Negative for unusual weight change.    Physical Examination:   BP (!) 160/98 (BP Location: Left Arm, Cuff Size: Normal)   Pulse 65   Temp 97.8 F (36.6 C) (Oral)   Ht 5\' 2"  (1.575 m)   Wt 167 lb 9.6 oz (76 kg)   BMI 30.65 kg/m   General: Well-nourished, well-developed in no acute distress.  Eyes: No icterus. Mouth: Oropharyngeal mucosa moist and pink , no lesions erythema or exudate. Lungs: Clear to auscultation bilaterally.  Heart: Regular rate and rhythm, no murmurs rubs or gallops.  Abdomen: Bowel sounds are normal, nontender, nondistended, no hepatosplenomegaly or masses, no abdominal bruits or hernia , no rebound or guarding.   Extremities: No lower extremity edema. No clubbing or deformities. Neuro: Alert and oriented x 4   Skin: Warm and dry, no jaundice.   Psych: Alert and cooperative, normal mood and affect.

## 2017-08-12 NOTE — Progress Notes (Signed)
cc'ed to pcp °

## 2017-08-12 NOTE — Patient Instructions (Signed)
1. Please recheck your blood pressure at home. If remains elevated, >140/90, discuss with Dr. Woody Seller.  2. Return to office as needed.

## 2017-08-12 NOTE — Assessment & Plan Note (Signed)
Clinically doing well. Heme positive stool likely due to hemorrhoids and AVMs. Hemoglobin 6 months ago. Advised patient to follow-up with PCP. Consider periodic hemoglobin to look for anemia as she had a single AVM in her colon which was treated but she could have additional AVMs in her upper GI tract/small bowel. Clinically no evidence of anemia today.  Blood pressure initially quite elevated whitecoat syndrome. Reports normal blood pressures at home. Improved with rechecked today.

## 2017-11-14 DIAGNOSIS — H40033 Anatomical narrow angle, bilateral: Secondary | ICD-10-CM | POA: Diagnosis not present

## 2017-11-14 DIAGNOSIS — H2513 Age-related nuclear cataract, bilateral: Secondary | ICD-10-CM | POA: Diagnosis not present

## 2018-12-28 DIAGNOSIS — Z299 Encounter for prophylactic measures, unspecified: Secondary | ICD-10-CM | POA: Diagnosis not present

## 2018-12-28 DIAGNOSIS — Z6831 Body mass index (BMI) 31.0-31.9, adult: Secondary | ICD-10-CM | POA: Diagnosis not present

## 2018-12-28 DIAGNOSIS — I10 Essential (primary) hypertension: Secondary | ICD-10-CM | POA: Diagnosis not present

## 2018-12-28 DIAGNOSIS — Z87891 Personal history of nicotine dependence: Secondary | ICD-10-CM | POA: Diagnosis not present

## 2018-12-28 DIAGNOSIS — E039 Hypothyroidism, unspecified: Secondary | ICD-10-CM | POA: Diagnosis not present

## 2018-12-28 DIAGNOSIS — Z2821 Immunization not carried out because of patient refusal: Secondary | ICD-10-CM | POA: Diagnosis not present

## 2018-12-30 DIAGNOSIS — Z961 Presence of intraocular lens: Secondary | ICD-10-CM | POA: Diagnosis not present

## 2018-12-30 DIAGNOSIS — H43812 Vitreous degeneration, left eye: Secondary | ICD-10-CM | POA: Diagnosis not present

## 2018-12-30 DIAGNOSIS — E113291 Type 2 diabetes mellitus with mild nonproliferative diabetic retinopathy without macular edema, right eye: Secondary | ICD-10-CM | POA: Diagnosis not present

## 2019-05-04 DIAGNOSIS — Z299 Encounter for prophylactic measures, unspecified: Secondary | ICD-10-CM | POA: Diagnosis not present

## 2019-05-04 DIAGNOSIS — E1165 Type 2 diabetes mellitus with hyperglycemia: Secondary | ICD-10-CM | POA: Diagnosis not present

## 2019-05-04 DIAGNOSIS — Z789 Other specified health status: Secondary | ICD-10-CM | POA: Diagnosis not present

## 2019-05-04 DIAGNOSIS — Z6830 Body mass index (BMI) 30.0-30.9, adult: Secondary | ICD-10-CM | POA: Diagnosis not present

## 2019-05-04 DIAGNOSIS — L98499 Non-pressure chronic ulcer of skin of other sites with unspecified severity: Secondary | ICD-10-CM | POA: Diagnosis not present

## 2019-05-04 DIAGNOSIS — I10 Essential (primary) hypertension: Secondary | ICD-10-CM | POA: Diagnosis not present

## 2019-05-12 DIAGNOSIS — Z6830 Body mass index (BMI) 30.0-30.9, adult: Secondary | ICD-10-CM | POA: Diagnosis not present

## 2019-05-12 DIAGNOSIS — I83009 Varicose veins of unspecified lower extremity with ulcer of unspecified site: Secondary | ICD-10-CM | POA: Diagnosis not present

## 2019-05-12 DIAGNOSIS — Z299 Encounter for prophylactic measures, unspecified: Secondary | ICD-10-CM | POA: Diagnosis not present

## 2019-05-12 DIAGNOSIS — L97909 Non-pressure chronic ulcer of unspecified part of unspecified lower leg with unspecified severity: Secondary | ICD-10-CM | POA: Diagnosis not present

## 2019-05-12 DIAGNOSIS — I10 Essential (primary) hypertension: Secondary | ICD-10-CM | POA: Diagnosis not present

## 2019-05-26 DIAGNOSIS — L97909 Non-pressure chronic ulcer of unspecified part of unspecified lower leg with unspecified severity: Secondary | ICD-10-CM | POA: Diagnosis not present

## 2019-05-26 DIAGNOSIS — I10 Essential (primary) hypertension: Secondary | ICD-10-CM | POA: Diagnosis not present

## 2019-05-26 DIAGNOSIS — I83009 Varicose veins of unspecified lower extremity with ulcer of unspecified site: Secondary | ICD-10-CM | POA: Diagnosis not present

## 2019-05-26 DIAGNOSIS — Z6829 Body mass index (BMI) 29.0-29.9, adult: Secondary | ICD-10-CM | POA: Diagnosis not present

## 2019-05-26 DIAGNOSIS — Z299 Encounter for prophylactic measures, unspecified: Secondary | ICD-10-CM | POA: Diagnosis not present

## 2019-05-26 DIAGNOSIS — L98499 Non-pressure chronic ulcer of skin of other sites with unspecified severity: Secondary | ICD-10-CM | POA: Diagnosis not present

## 2019-06-15 DIAGNOSIS — R5383 Other fatigue: Secondary | ICD-10-CM | POA: Diagnosis not present

## 2019-06-15 DIAGNOSIS — E039 Hypothyroidism, unspecified: Secondary | ICD-10-CM | POA: Diagnosis not present

## 2019-06-15 DIAGNOSIS — Z0000 Encounter for general adult medical examination without abnormal findings: Secondary | ICD-10-CM | POA: Diagnosis not present

## 2019-06-15 DIAGNOSIS — Z7189 Other specified counseling: Secondary | ICD-10-CM | POA: Diagnosis not present

## 2019-06-15 DIAGNOSIS — Z6829 Body mass index (BMI) 29.0-29.9, adult: Secondary | ICD-10-CM | POA: Diagnosis not present

## 2019-06-15 DIAGNOSIS — Z299 Encounter for prophylactic measures, unspecified: Secondary | ICD-10-CM | POA: Diagnosis not present

## 2019-06-15 DIAGNOSIS — E1165 Type 2 diabetes mellitus with hyperglycemia: Secondary | ICD-10-CM | POA: Diagnosis not present

## 2019-06-15 DIAGNOSIS — E785 Hyperlipidemia, unspecified: Secondary | ICD-10-CM | POA: Diagnosis not present

## 2019-06-15 DIAGNOSIS — I10 Essential (primary) hypertension: Secondary | ICD-10-CM | POA: Diagnosis not present

## 2019-06-15 DIAGNOSIS — Z79899 Other long term (current) drug therapy: Secondary | ICD-10-CM | POA: Diagnosis not present

## 2019-06-15 DIAGNOSIS — Z1331 Encounter for screening for depression: Secondary | ICD-10-CM | POA: Diagnosis not present

## 2019-06-15 DIAGNOSIS — Z1339 Encounter for screening examination for other mental health and behavioral disorders: Secondary | ICD-10-CM | POA: Diagnosis not present

## 2019-06-15 DIAGNOSIS — Z1211 Encounter for screening for malignant neoplasm of colon: Secondary | ICD-10-CM | POA: Diagnosis not present

## 2019-07-29 DIAGNOSIS — Z1231 Encounter for screening mammogram for malignant neoplasm of breast: Secondary | ICD-10-CM | POA: Diagnosis not present

## 2019-09-15 DIAGNOSIS — Z1159 Encounter for screening for other viral diseases: Secondary | ICD-10-CM | POA: Diagnosis not present

## 2020-01-10 DIAGNOSIS — E039 Hypothyroidism, unspecified: Secondary | ICD-10-CM | POA: Diagnosis not present

## 2020-01-10 DIAGNOSIS — E1165 Type 2 diabetes mellitus with hyperglycemia: Secondary | ICD-10-CM | POA: Diagnosis not present

## 2020-01-10 DIAGNOSIS — I10 Essential (primary) hypertension: Secondary | ICD-10-CM | POA: Diagnosis not present

## 2020-01-10 DIAGNOSIS — E785 Hyperlipidemia, unspecified: Secondary | ICD-10-CM | POA: Diagnosis not present

## 2020-01-10 DIAGNOSIS — Z299 Encounter for prophylactic measures, unspecified: Secondary | ICD-10-CM | POA: Diagnosis not present

## 2020-01-11 DIAGNOSIS — E119 Type 2 diabetes mellitus without complications: Secondary | ICD-10-CM | POA: Diagnosis not present

## 2020-01-11 DIAGNOSIS — Z961 Presence of intraocular lens: Secondary | ICD-10-CM | POA: Diagnosis not present

## 2020-01-11 DIAGNOSIS — H43812 Vitreous degeneration, left eye: Secondary | ICD-10-CM | POA: Diagnosis not present

## 2020-01-11 DIAGNOSIS — H5213 Myopia, bilateral: Secondary | ICD-10-CM | POA: Diagnosis not present

## 2020-01-11 DIAGNOSIS — H524 Presbyopia: Secondary | ICD-10-CM | POA: Diagnosis not present

## 2020-01-11 DIAGNOSIS — H52203 Unspecified astigmatism, bilateral: Secondary | ICD-10-CM | POA: Diagnosis not present

## 2020-01-17 DIAGNOSIS — E1140 Type 2 diabetes mellitus with diabetic neuropathy, unspecified: Secondary | ICD-10-CM | POA: Diagnosis not present

## 2020-01-17 DIAGNOSIS — E1159 Type 2 diabetes mellitus with other circulatory complications: Secondary | ICD-10-CM | POA: Diagnosis not present

## 2020-02-09 DIAGNOSIS — E039 Hypothyroidism, unspecified: Secondary | ICD-10-CM | POA: Diagnosis not present

## 2020-02-09 DIAGNOSIS — E1165 Type 2 diabetes mellitus with hyperglycemia: Secondary | ICD-10-CM | POA: Diagnosis not present

## 2020-02-09 DIAGNOSIS — Z299 Encounter for prophylactic measures, unspecified: Secondary | ICD-10-CM | POA: Diagnosis not present

## 2020-02-09 DIAGNOSIS — I10 Essential (primary) hypertension: Secondary | ICD-10-CM | POA: Diagnosis not present

## 2020-02-09 DIAGNOSIS — Z6829 Body mass index (BMI) 29.0-29.9, adult: Secondary | ICD-10-CM | POA: Diagnosis not present

## 2020-10-08 DIAGNOSIS — Z299 Encounter for prophylactic measures, unspecified: Secondary | ICD-10-CM | POA: Diagnosis not present

## 2020-10-08 DIAGNOSIS — R5383 Other fatigue: Secondary | ICD-10-CM | POA: Diagnosis not present

## 2020-10-08 DIAGNOSIS — E039 Hypothyroidism, unspecified: Secondary | ICD-10-CM | POA: Diagnosis not present

## 2020-10-08 DIAGNOSIS — I10 Essential (primary) hypertension: Secondary | ICD-10-CM | POA: Diagnosis not present

## 2020-10-08 DIAGNOSIS — Z79899 Other long term (current) drug therapy: Secondary | ICD-10-CM | POA: Diagnosis not present

## 2020-10-08 DIAGNOSIS — Z6829 Body mass index (BMI) 29.0-29.9, adult: Secondary | ICD-10-CM | POA: Diagnosis not present

## 2020-10-08 DIAGNOSIS — Z1331 Encounter for screening for depression: Secondary | ICD-10-CM | POA: Diagnosis not present

## 2020-10-08 DIAGNOSIS — Z1339 Encounter for screening examination for other mental health and behavioral disorders: Secondary | ICD-10-CM | POA: Diagnosis not present

## 2020-10-08 DIAGNOSIS — E1165 Type 2 diabetes mellitus with hyperglycemia: Secondary | ICD-10-CM | POA: Diagnosis not present

## 2020-10-08 DIAGNOSIS — Z7189 Other specified counseling: Secondary | ICD-10-CM | POA: Diagnosis not present

## 2020-10-08 DIAGNOSIS — E7800 Pure hypercholesterolemia, unspecified: Secondary | ICD-10-CM | POA: Diagnosis not present

## 2020-10-08 DIAGNOSIS — Z0000 Encounter for general adult medical examination without abnormal findings: Secondary | ICD-10-CM | POA: Diagnosis not present

## 2020-11-07 DIAGNOSIS — E1165 Type 2 diabetes mellitus with hyperglycemia: Secondary | ICD-10-CM | POA: Diagnosis not present

## 2020-11-07 DIAGNOSIS — E039 Hypothyroidism, unspecified: Secondary | ICD-10-CM | POA: Diagnosis not present

## 2020-11-07 DIAGNOSIS — I10 Essential (primary) hypertension: Secondary | ICD-10-CM | POA: Diagnosis not present

## 2020-11-07 DIAGNOSIS — N644 Mastodynia: Secondary | ICD-10-CM | POA: Diagnosis not present

## 2020-11-07 DIAGNOSIS — Z299 Encounter for prophylactic measures, unspecified: Secondary | ICD-10-CM | POA: Diagnosis not present

## 2020-11-16 DIAGNOSIS — Z803 Family history of malignant neoplasm of breast: Secondary | ICD-10-CM | POA: Diagnosis not present

## 2020-11-16 DIAGNOSIS — R928 Other abnormal and inconclusive findings on diagnostic imaging of breast: Secondary | ICD-10-CM | POA: Diagnosis not present

## 2020-11-16 DIAGNOSIS — N6325 Unspecified lump in the left breast, overlapping quadrants: Secondary | ICD-10-CM | POA: Diagnosis not present

## 2020-11-16 DIAGNOSIS — N6032 Fibrosclerosis of left breast: Secondary | ICD-10-CM | POA: Diagnosis not present

## 2020-11-16 DIAGNOSIS — R922 Inconclusive mammogram: Secondary | ICD-10-CM | POA: Diagnosis not present

## 2020-11-16 DIAGNOSIS — N6452 Nipple discharge: Secondary | ICD-10-CM | POA: Diagnosis not present

## 2020-11-16 DIAGNOSIS — N644 Mastodynia: Secondary | ICD-10-CM | POA: Diagnosis not present

## 2021-02-26 DIAGNOSIS — H43812 Vitreous degeneration, left eye: Secondary | ICD-10-CM | POA: Diagnosis not present

## 2021-02-26 DIAGNOSIS — Z961 Presence of intraocular lens: Secondary | ICD-10-CM | POA: Diagnosis not present

## 2021-02-26 DIAGNOSIS — E119 Type 2 diabetes mellitus without complications: Secondary | ICD-10-CM | POA: Diagnosis not present

## 2021-04-15 ENCOUNTER — Ambulatory Visit: Payer: Self-pay | Admitting: Surgery

## 2021-04-15 DIAGNOSIS — N6342 Unspecified lump in left breast, subareolar: Secondary | ICD-10-CM

## 2021-04-15 DIAGNOSIS — N6452 Nipple discharge: Secondary | ICD-10-CM | POA: Diagnosis not present

## 2021-04-15 DIAGNOSIS — N644 Mastodynia: Secondary | ICD-10-CM | POA: Diagnosis not present

## 2021-04-15 DIAGNOSIS — N6453 Retraction of nipple: Secondary | ICD-10-CM | POA: Diagnosis not present

## 2021-04-15 NOTE — H&P (Signed)
History of Present Illness [name]. Somalia Segler MD; 04/15/2021 11:41 AM) The patient is a 85 year old female who presents with a breast mass. Referred by Dr. Woody Seller for left nipple discharge and subareolar mass  This is a 85 year old female who presents with several months of left nipple discharge (nonbloody), left nipple retraction, and tenderness around the left nipple. She underwent mammogram and ultrasound at [facility] rocking him. The patient has a fairly complex mammogram with widely scattered calcifications. However she had an area in the retroareolar region that showed some large dilated ducts that were filled with debris. She underwent biopsy of this area. This confirmed dilated ducts filled with cellular debris. However the patient continues to have tenderness and occasional discharge in this area. No family history of breast cancer. She was initially referred to [place] 5 months ago but has canceled a couple of her appointments. "Life got in the way"   Problem List/Past Medical [name]. Alekzander Cardell, MD; 04/15/2021 11:41 AM) DISCHARGE FROM LEFT NIPPLE (N64.52) NIPPLE RETRACTION (N64.53) NIPPLE TENDERNESS (N64.4)  Past Surgical History (Nicanor Mendolia K. Braedyn Riggle, MD; 04/15/2021 11:41 AM) Breast Biopsy Bilateral. Breast Mass; Local Excision Right. Cataract Surgery Bilateral. Foot Surgery Right. Gallbladder Surgery - Laparoscopic Thyroid Surgery Tonsillectomy  Diagnostic Studies History [name]. Caleesi Kohl, MD; 04/15/2021 11:41 AM) Colonoscopy 1-5 years ago Mammogram within last year Pap Smear >5 years ago  Allergies Rodman Key K. Logen Heintzelman, MD; 04/15/2021 11:41 AM) Ciprofloxacin *FLUOROQUINOLONES* Anaphylaxis. Motrin *ANALGESICS - ANTI-INFLAMMATORY* Itching, Rash. Naproxen *ANALGESICS - ANTI-INFLAMMATORY* Itching, Rash. Septra *ANTI-INFECTIVE AGENTS - MISC.* Itching, Rash. Tylenol *ANALGESICS - NonNarcotic* Swelling.  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 Levothyroxine Sodium (75MCG Tablet, Oral) Active. Medications Reconciled  Social History [name]. Koa Palla, MD; 04/15/2021 11:41 AM) Caffeine use Coffee, Tea. No alcohol use No drug use Tobacco use Former smoker.  Family History [name]. Kailie Polus, MD; 04/15/2021 11:41 AM) Arthritis Father. Breast Cancer Family Members In General. Diabetes Mellitus Father. Heart Disease Mother. Hypertension Father. Malignant Neoplasm Of Pancreas Family Members In General.  Pregnancy / Birth History [name]. Kaileen Bronkema, MD; 04/15/2021 11:41 AM) Age at menarche 58 years. Age of menopause 45-55 Gravida 6 Length (months) of breastfeeding 12-24 Maternal age 30-20 Para 25  Other Problems [name]. Rickeya Manus, MD; 04/15/2021 11:41 AM) Diabetes Mellitus Thyroid Disease    [employer] Advertising account planner Robinson CNA; 04/15/2021 10:21 AM) 04/15/2021 10:20 AM Weight: 166.2 lb Height: 62in Body Surface Area: 1.77 m Body Mass Index: 30.4 kg/m  Temp.: 97.65F  Pulse: 75 (Regular)  P.OX: 95% (Room air) BP: 186/78(Sitting, Left Arm, Standard)        Physical Exam Rodman Key K. Troy Kanouse MD; 04/15/2021 11:41 AM)  The physical exam findings are as follows: Note:Constitutional: WDWN in NAD, conversant, no obvious deformities; resting comfortably Eyes: Pupils equal, round; sclera anicteric; moist conjunctiva; no lid lag HENT: Oral mucosa moist; good dentition Neck: No masses palpated, trachea midline; no thyromegaly Lungs: CTA bilaterally; normal respiratory effort Breast: Right breast shows normal-appearing nipple with no discharge, no palpable masses, no axillary lymphadenopathy The left breast shows a mildly retracted central nipple, there is minimal crusting around the edge of the nipple, there is some firmness in the retroareolar region that is tender. No other breast masses palpated. No axillary lymphadenopathy. CV: Regular rate and rhythm; no murmurs; extremities  well-perfused with no edema Abd: +bowel sounds, soft, non-tender, no palpable organomegaly; no palpable hernias Musc: Normal gait; no apparent clubbing or cyanosis in extremities Lymphatic: No palpable cervical  or axillary lymphadenopathy Skin: Warm, dry; no sign of jaundice Psychiatric - alert and oriented x 4; calm mood and affect    Assessment & Plan Rodman Key K. Demetrius Mahler MD; 04/15/2021 11:42 AM)  DISCHARGE FROM LEFT NIPPLE (N64.52)   NIPPLE RETRACTION (N64.53)   NIPPLE TENDERNESS (N64.4)  Current Plans Schedule for Surgery - Left radioactive seed localized lumpectomy. The surgical procedure has been discussed with the patient. Potential risks, benefits, alternative treatments, and expected outcomes have been explained. All of the patient's questions at this time have been answered. The likelihood of reaching the patient's treatment goal is good. The patient understand the proposed surgical procedure and wishes to proceed. Note:Due to the nipple retraction, episodes of nipple discharge, and the firmness behind the area alone, would recommend a lumpectomy of this retroareolar mass.  [name]. Georgette Dover, MD, [facility]  General/ Trauma Surgery   04/15/2021 11:42 AM

## 2021-04-15 NOTE — H&P (View-Only) (Signed)
History of Present Illness [name]. Deborah Homann MD; 04/15/2021 11:41 AM) The patient is a 85 year old female who presents with a breast mass. Referred by Dr. Woody Seller for left nipple discharge and subareolar mass  This is a 85 year old female who presents with several months of left nipple discharge (nonbloody), left nipple retraction, and tenderness around the left nipple. She underwent mammogram and ultrasound at [facility] him. The patient has a fairly complex mammogram with widely scattered calcifications. However she had an area in the retroareolar region that showed some large dilated ducts that were filled with debris. She underwent biopsy of this area. This confirmed dilated ducts filled with cellular debris. However the patient continues to have tenderness and occasional discharge in this area. No family history of breast cancer. She was initially referred to [place] 5 months ago but has canceled a couple of her appointments. "Life got in the way"   Problem List/Past Medical [name]. Myldred Raju, MD; 04/15/2021 11:41 AM) DISCHARGE FROM LEFT NIPPLE (N64.52) NIPPLE RETRACTION (N64.53) NIPPLE TENDERNESS (N64.4)  Past Surgical History (Aza Dantes K. Mate Alegria, MD; 04/15/2021 11:41 AM) Breast Biopsy Bilateral. Breast Mass; Local Excision Right. Cataract Surgery Bilateral. Foot Surgery Right. Gallbladder Surgery - Laparoscopic Thyroid Surgery Tonsillectomy  Diagnostic Studies History [name]. Shanan Mcmiller, MD; 04/15/2021 11:41 AM) Colonoscopy 1-5 years ago Mammogram within last year Pap Smear >5 years ago  Allergies Rodman Key K. Fernande Treiber, MD; 04/15/2021 11:41 AM) Ciprofloxacin *FLUOROQUINOLONES* Anaphylaxis. Motrin *ANALGESICS - ANTI-INFLAMMATORY* Itching, Rash. Naproxen *ANALGESICS - ANTI-INFLAMMATORY* Itching, Rash. Septra *ANTI-INFECTIVE AGENTS - MISC.* Itching, Rash. Tylenol *ANALGESICS - NonNarcotic* Swelling.  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 Levothyroxine Sodium (75MCG Tablet, Oral) Active. Medications Reconciled  Social History [name]. Joory Gough, MD; 04/15/2021 11:41 AM) Caffeine use Coffee, Tea. No alcohol use No drug use Tobacco use Former smoker.  Family History [name]. Uzoma Vivona, MD; 04/15/2021 11:41 AM) Arthritis Father. Breast Cancer Family Members In General. Diabetes Mellitus Father. Heart Disease Mother. Hypertension Father. Malignant Neoplasm Of Pancreas Family Members In General.  Pregnancy / Birth History [name]. Tylisha Danis, MD; 04/15/2021 11:41 AM) Age at menarche 58 years. Age of menopause 45-55 Gravida 6 Length (months) of breastfeeding 12-24 Maternal age 30-20 Para 25  Other Problems [name]. 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 Felicia Both MD; 04/15/2021 11:41 AM)  The physical exam findings are as follows: Note:Constitutional: WDWN in NAD, conversant, no obvious deformities; resting comfortably Eyes: Pupils equal, round; sclera anicteric; moist conjunctiva; no lid lag HENT: Oral mucosa moist; good dentition Neck: No masses palpated, trachea midline; no thyromegaly Lungs: CTA bilaterally; normal respiratory effort Breast: Right breast shows normal-appearing nipple with no discharge, no palpable masses, no axillary lymphadenopathy The left breast shows a mildly retracted central nipple, there is minimal crusting around the edge of the nipple, there is some firmness in the retroareolar region that is tender. No other breast masses palpated. No axillary lymphadenopathy. CV: Regular rate and rhythm; no murmurs; extremities  well-perfused with no edema Abd: +bowel sounds, soft, non-tender, no palpable organomegaly; no palpable hernias Musc: Normal gait; no apparent clubbing or cyanosis in extremities Lymphatic: No palpable cervical  or axillary lymphadenopathy Skin: Warm, dry; no sign of jaundice Psychiatric - alert and oriented x 4; calm mood and affect    Assessment & Plan Rodman Key K. Audra Bellard MD; 04/15/2021 11:42 AM)  DISCHARGE FROM LEFT NIPPLE (N64.52)   NIPPLE RETRACTION (N64.53)   NIPPLE TENDERNESS (N64.4)  Current Plans Schedule for Surgery - Left radioactive seed localized lumpectomy. The surgical procedure has been discussed with the patient. Potential risks, benefits, alternative treatments, and expected outcomes have been explained. All of the patient's questions at this time have been answered. The likelihood of reaching the patient's treatment goal is good. The patient understand the proposed surgical procedure and wishes to proceed. Note:Due to the nipple retraction, episodes of nipple discharge, and the firmness behind the area alone, would recommend a lumpectomy of this retroareolar mass.  [name]. Georgette Dover, MD, [facility]  General/ Trauma Surgery   04/15/2021 11:42 AM

## 2021-04-30 ENCOUNTER — Other Ambulatory Visit: Payer: Self-pay

## 2021-04-30 ENCOUNTER — Encounter (HOSPITAL_BASED_OUTPATIENT_CLINIC_OR_DEPARTMENT_OTHER): Payer: Self-pay | Admitting: Surgery

## 2021-05-03 ENCOUNTER — Other Ambulatory Visit (HOSPITAL_COMMUNITY): Payer: Medicare Other

## 2021-05-03 ENCOUNTER — Other Ambulatory Visit: Payer: Self-pay | Admitting: Surgery

## 2021-05-03 DIAGNOSIS — N6342 Unspecified lump in left breast, subareolar: Secondary | ICD-10-CM

## 2021-05-06 ENCOUNTER — Other Ambulatory Visit: Payer: Self-pay

## 2021-05-06 ENCOUNTER — Ambulatory Visit
Admission: RE | Admit: 2021-05-06 | Discharge: 2021-05-06 | Disposition: A | Discharge status: Home / Self Care | Payer: 59 | Source: Ambulatory Visit | Attending: Surgery | Admitting: Surgery

## 2021-05-06 DIAGNOSIS — N6342 Unspecified lump in left breast, subareolar: Secondary | ICD-10-CM

## 2021-05-06 DIAGNOSIS — R928 Other abnormal and inconclusive findings on diagnostic imaging of breast: Secondary | ICD-10-CM | POA: Diagnosis not present

## 2021-05-06 NOTE — Progress Notes (Signed)

## 2021-05-06 NOTE — Anesthesia Preprocedure Evaluation (Addendum)
Anesthesia Evaluation  Patient identified by MRN, date of birth, ID band Patient awake    Reviewed: Allergy & Precautions, NPO status , Patient's Chart, lab work & pertinent test results  Airway Mallampati: II  TM Distance: >3 FB     Dental no notable dental hx. (+) Dental Advisory Given   Pulmonary neg pulmonary ROS, former smoker,    Pulmonary exam normal        Cardiovascular negative cardio ROS Normal cardiovascular exam     Neuro/Psych negative neurological ROS     GI/Hepatic negative GI ROS, Neg liver ROS,   Endo/Other  diabetesHypothyroidism   Renal/GU negative Renal ROS     Musculoskeletal negative musculoskeletal ROS (+)   Abdominal   Peds  Hematology negative hematology ROS (+)   Anesthesia Other Findings   Reproductive/Obstetrics                            Anesthesia Physical Anesthesia Plan  ASA: II  Anesthesia Plan: General   Post-op Pain Management:    Induction: Intravenous  PONV Risk Score and Plan: 4 or greater and Ondansetron, Dexamethasone and Diphenhydramine  Airway Management Planned: LMA  Additional Equipment:   Intra-op Plan:   Post-operative Plan: Extubation in OR  Informed Consent: I have reviewed the patients History and Physical, chart, labs and discussed the procedure including the risks, benefits and alternatives for the proposed anesthesia with the patient or authorized representative who has indicated his/her understanding and acceptance.     Dental advisory given  Plan Discussed with: Anesthesiologist  Anesthesia Plan Comments:        Anesthesia Quick Evaluation

## 2021-05-07 ENCOUNTER — Ambulatory Visit (HOSPITAL_BASED_OUTPATIENT_CLINIC_OR_DEPARTMENT_OTHER): Payer: Medicare Other | Admitting: Anesthesiology

## 2021-05-07 ENCOUNTER — Ambulatory Visit (HOSPITAL_BASED_OUTPATIENT_CLINIC_OR_DEPARTMENT_OTHER)
Admission: RE | Admit: 2021-05-07 | Discharge: 2021-05-07 | Disposition: A | Discharge status: Home / Self Care | Payer: Medicare Other | Attending: Surgery | Admitting: Surgery

## 2021-05-07 ENCOUNTER — Other Ambulatory Visit: Payer: Self-pay

## 2021-05-07 ENCOUNTER — Encounter (HOSPITAL_BASED_OUTPATIENT_CLINIC_OR_DEPARTMENT_OTHER)
Admission: RE | Disposition: A | Discharge status: Home / Self Care | Payer: Self-pay | Source: Home / Self Care | Attending: Surgery

## 2021-05-07 ENCOUNTER — Ambulatory Visit
Admission: RE | Admit: 2021-05-07 | Discharge: 2021-05-07 | Disposition: A | Discharge status: Home / Self Care | Payer: 59 | Source: Ambulatory Visit | Attending: Surgery | Admitting: Surgery

## 2021-05-07 ENCOUNTER — Encounter (HOSPITAL_BASED_OUTPATIENT_CLINIC_OR_DEPARTMENT_OTHER): Payer: Self-pay | Admitting: Surgery

## 2021-05-07 DIAGNOSIS — Z8261 Family history of arthritis: Secondary | ICD-10-CM | POA: Insufficient documentation

## 2021-05-07 DIAGNOSIS — Z800 Family history of malignant neoplasm of digestive organs: Secondary | ICD-10-CM | POA: Diagnosis not present

## 2021-05-07 DIAGNOSIS — Z888 Allergy status to other drugs, medicaments and biological substances status: Secondary | ICD-10-CM | POA: Insufficient documentation

## 2021-05-07 DIAGNOSIS — Z881 Allergy status to other antibiotic agents status: Secondary | ICD-10-CM | POA: Insufficient documentation

## 2021-05-07 DIAGNOSIS — N6459 Other signs and symptoms in breast: Secondary | ICD-10-CM | POA: Diagnosis not present

## 2021-05-07 DIAGNOSIS — N6342 Unspecified lump in left breast, subareolar: Secondary | ICD-10-CM | POA: Diagnosis not present

## 2021-05-07 DIAGNOSIS — E039 Hypothyroidism, unspecified: Secondary | ICD-10-CM | POA: Diagnosis not present

## 2021-05-07 DIAGNOSIS — N6042 Mammary duct ectasia of left breast: Secondary | ICD-10-CM | POA: Diagnosis not present

## 2021-05-07 DIAGNOSIS — Z803 Family history of malignant neoplasm of breast: Secondary | ICD-10-CM | POA: Insufficient documentation

## 2021-05-07 DIAGNOSIS — N6489 Other specified disorders of breast: Secondary | ICD-10-CM | POA: Diagnosis not present

## 2021-05-07 DIAGNOSIS — Z8249 Family history of ischemic heart disease and other diseases of the circulatory system: Secondary | ICD-10-CM | POA: Diagnosis not present

## 2021-05-07 DIAGNOSIS — Z87891 Personal history of nicotine dependence: Secondary | ICD-10-CM | POA: Diagnosis not present

## 2021-05-07 DIAGNOSIS — Z833 Family history of diabetes mellitus: Secondary | ICD-10-CM | POA: Insufficient documentation

## 2021-05-07 DIAGNOSIS — N6452 Nipple discharge: Secondary | ICD-10-CM | POA: Insufficient documentation

## 2021-05-07 DIAGNOSIS — N6032 Fibrosclerosis of left breast: Secondary | ICD-10-CM | POA: Diagnosis not present

## 2021-05-07 DIAGNOSIS — E119 Type 2 diabetes mellitus without complications: Secondary | ICD-10-CM | POA: Insufficient documentation

## 2021-05-07 DIAGNOSIS — R928 Other abnormal and inconclusive findings on diagnostic imaging of breast: Secondary | ICD-10-CM | POA: Diagnosis not present

## 2021-05-07 DIAGNOSIS — R921 Mammographic calcification found on diagnostic imaging of breast: Secondary | ICD-10-CM | POA: Diagnosis not present

## 2021-05-07 HISTORY — PX: BREAST LUMPECTOMY WITH RADIOACTIVE SEED LOCALIZATION: SHX6424

## 2021-05-07 LAB — GLUCOSE, CAPILLARY: Glucose-Capillary: 214 mg/dL — ABNORMAL HIGH (ref 70–99)

## 2021-05-07 SURGERY — BREAST LUMPECTOMY WITH RADIOACTIVE SEED LOCALIZATION
Anesthesia: General | Site: Breast | Laterality: Left

## 2021-05-07 MED ORDER — BUPIVACAINE-EPINEPHRINE 0.25% -1:200000 IJ SOLN
INTRAMUSCULAR | Status: DC | PRN
  Administered 2021-05-07: 10 mL

## 2021-05-07 MED ORDER — CHLORHEXIDINE GLUCONATE CLOTH 2 % EX PADS: 6.0000 | MEDICATED_PAD | Freq: Once | CUTANEOUS | Status: DC

## 2021-05-07 MED ORDER — EPHEDRINE 5 MG/ML INJ
INTRAVENOUS | Status: AC
  Filled 2021-05-07: qty 20

## 2021-05-07 MED ORDER — PROPOFOL 500 MG/50ML IV EMUL
INTRAVENOUS | Status: AC
  Filled 2021-05-07: qty 100

## 2021-05-07 MED ORDER — LIDOCAINE 2% (20 MG/ML) 5 ML SYRINGE
INTRAMUSCULAR | Status: DC | PRN
  Administered 2021-05-07: 60 mg via INTRAVENOUS

## 2021-05-07 MED ORDER — VANCOMYCIN HCL IN DEXTROSE 1-5 GM/200ML-% IV SOLN
1000.0000 mg | INTRAVENOUS | Status: AC
  Administered 2021-05-07: 1000 mg via INTRAVENOUS

## 2021-05-07 MED ORDER — DEXAMETHASONE SODIUM PHOSPHATE 4 MG/ML IJ SOLN
INTRAMUSCULAR | Status: DC | PRN
  Administered 2021-05-07: 4 mg via INTRAVENOUS

## 2021-05-07 MED ORDER — PROPOFOL 10 MG/ML IV BOLUS
INTRAVENOUS | Status: AC
  Filled 2021-05-07: qty 20

## 2021-05-07 MED ORDER — PROPOFOL 10 MG/ML IV BOLUS
INTRAVENOUS | Status: DC | PRN
  Administered 2021-05-07: 150 mg via INTRAVENOUS

## 2021-05-07 MED ORDER — DEXAMETHASONE SODIUM PHOSPHATE 10 MG/ML IJ SOLN
INTRAMUSCULAR | Status: AC
  Filled 2021-05-07: qty 2

## 2021-05-07 MED ORDER — FENTANYL CITRATE (PF) 100 MCG/2ML IJ SOLN
INTRAMUSCULAR | Status: AC
  Filled 2021-05-07: qty 2

## 2021-05-07 MED ORDER — LACTATED RINGERS IV SOLN: INTRAVENOUS | Status: DC

## 2021-05-07 MED ORDER — PHENYLEPHRINE 40 MCG/ML (10ML) SYRINGE FOR IV PUSH (FOR BLOOD PRESSURE SUPPORT)
PREFILLED_SYRINGE | INTRAVENOUS | Status: AC
  Filled 2021-05-07: qty 10

## 2021-05-07 MED ORDER — FENTANYL CITRATE (PF) 100 MCG/2ML IJ SOLN
INTRAMUSCULAR | Status: DC | PRN
  Administered 2021-05-07 (×2): 50 ug via INTRAVENOUS

## 2021-05-07 MED ORDER — LIDOCAINE 2% (20 MG/ML) 5 ML SYRINGE
INTRAMUSCULAR | Status: AC
  Filled 2021-05-07: qty 15

## 2021-05-07 MED ORDER — ONDANSETRON HCL 4 MG/2ML IJ SOLN
INTRAMUSCULAR | Status: AC
  Filled 2021-05-07: qty 12

## 2021-05-07 MED ORDER — VANCOMYCIN HCL IN DEXTROSE 1-5 GM/200ML-% IV SOLN
INTRAVENOUS | Status: AC
  Filled 2021-05-07: qty 200

## 2021-05-07 MED ORDER — PROPOFOL 500 MG/50ML IV EMUL
INTRAVENOUS | Status: DC | PRN
  Administered 2021-05-07: 25 ug/kg/min via INTRAVENOUS

## 2021-05-07 MED ORDER — DEXMEDETOMIDINE (PRECEDEX) IN NS 20 MCG/5ML (4 MCG/ML) IV SYRINGE
PREFILLED_SYRINGE | INTRAVENOUS | Status: AC
  Filled 2021-05-07: qty 5

## 2021-05-07 MED ORDER — FENTANYL CITRATE (PF) 100 MCG/2ML IJ SOLN: 25.0000 ug | INTRAMUSCULAR | Status: DC | PRN

## 2021-05-07 MED ORDER — ONDANSETRON HCL 4 MG/2ML IJ SOLN
INTRAMUSCULAR | Status: DC | PRN
  Administered 2021-05-07: 4 mg via INTRAVENOUS

## 2021-05-07 SURGICAL SUPPLY — 51 items
APL PRP STRL LF DISP 70% ISPRP (MISCELLANEOUS) ×1
APL SKNCLS STERI-STRIP NONHPOA (GAUZE/BANDAGES/DRESSINGS) ×1
APPLIER CLIP 9.375 MED OPEN (MISCELLANEOUS)
APR CLP MED 9.3 20 MLT OPN (MISCELLANEOUS)
BENZOIN TINCTURE PRP APPL 2/3 (GAUZE/BANDAGES/DRESSINGS) ×2 IMPLANT
BLADE HEX COATED 2.75 (ELECTRODE) ×2 IMPLANT
BLADE SURG 15 STRL LF DISP TIS (BLADE) ×1 IMPLANT
BLADE SURG 15 STRL SS (BLADE) ×2
CANISTER SUC SOCK COL 7IN (MISCELLANEOUS) IMPLANT
CANISTER SUCT 1200ML W/VALVE (MISCELLANEOUS) IMPLANT
CHLORAPREP W/TINT 26 (MISCELLANEOUS) ×2 IMPLANT
CLIP APPLIE 9.375 MED OPEN (MISCELLANEOUS) IMPLANT
COVER BACK TABLE 60X90IN (DRAPES) ×2 IMPLANT
COVER MAYO STAND STRL (DRAPES) ×2 IMPLANT
COVER PROBE W GEL 5X96 (DRAPES) ×2 IMPLANT
DECANTER SPIKE VIAL GLASS SM (MISCELLANEOUS) IMPLANT
DRAPE LAPAROTOMY 100X72 PEDS (DRAPES) ×2 IMPLANT
DRAPE UTILITY XL STRL (DRAPES) ×2 IMPLANT
DRSG TEGADERM 4X4.75 (GAUZE/BANDAGES/DRESSINGS) ×2 IMPLANT
ELECT REM PT RETURN 9FT ADLT (ELECTROSURGICAL) ×2
ELECTRODE REM PT RTRN 9FT ADLT (ELECTROSURGICAL) ×1 IMPLANT
GAUZE SPONGE 4X4 12PLY STRL LF (GAUZE/BANDAGES/DRESSINGS) ×2 IMPLANT
GLOVE SURG ENC MOIS LTX SZ7 (GLOVE) ×2 IMPLANT
GLOVE SURG LTX SZ6.5 (GLOVE) ×2 IMPLANT
GLOVE SURG ORTHO LTX SZ9 (GLOVE) ×2 IMPLANT
GLOVE SURG UNDER POLY LF SZ7 (GLOVE) ×2 IMPLANT
GLOVE SURG UNDER POLY LF SZ7.5 (GLOVE) ×2 IMPLANT
GOWN STRL REUS W/ TWL LRG LVL3 (GOWN DISPOSABLE) ×2 IMPLANT
GOWN STRL REUS W/TWL LRG LVL3 (GOWN DISPOSABLE) ×4
ILLUMINATOR WAVEGUIDE N/F (MISCELLANEOUS) IMPLANT
KIT MARKER MARGIN INK (KITS) ×2 IMPLANT
LIGHT WAVEGUIDE WIDE FLAT (MISCELLANEOUS) IMPLANT
NEEDLE HYPO 25X1 1.5 SAFETY (NEEDLE) ×2 IMPLANT
NS IRRIG 1000ML POUR BTL (IV SOLUTION) ×2 IMPLANT
PACK BASIN DAY SURGERY FS (CUSTOM PROCEDURE TRAY) ×2 IMPLANT
PENCIL SMOKE EVACUATOR (MISCELLANEOUS) ×2 IMPLANT
SLEEVE SCD COMPRESS KNEE MED (STOCKING) ×2 IMPLANT
SPONGE GAUZE 2X2 8PLY STRL LF (GAUZE/BANDAGES/DRESSINGS) IMPLANT
SPONGE LAP 18X18 RF (DISPOSABLE) IMPLANT
SPONGE LAP 4X18 RFD (DISPOSABLE) ×2 IMPLANT
STRIP CLOSURE SKIN 1/2X4 (GAUZE/BANDAGES/DRESSINGS) ×2 IMPLANT
SUT MON AB 4-0 PC3 18 (SUTURE) ×2 IMPLANT
SUT SILK 2 0 SH (SUTURE) IMPLANT
SUT VIC AB 3-0 SH 27 (SUTURE) ×2
SUT VIC AB 3-0 SH 27X BRD (SUTURE) ×1 IMPLANT
SYR BULB EAR ULCER 3OZ GRN STR (SYRINGE) IMPLANT
SYR CONTROL 10ML LL (SYRINGE) ×2 IMPLANT
TOWEL GREEN STERILE FF (TOWEL DISPOSABLE) ×2 IMPLANT
TRAY FAXITRON CT DISP (TRAY / TRAY PROCEDURE) ×2 IMPLANT
TUBE CONNECTING 20X1/4 (TUBING) IMPLANT
YANKAUER SUCT BULB TIP NO VENT (SUCTIONS) IMPLANT

## 2021-05-07 NOTE — Anesthesia Postprocedure Evaluation (Signed)
Anesthesia Post Note  Patient: [name]  Procedure(s) Performed: LEFT BREAST LUMPECTOMY WITH RADIOACTIVE SEED LOCALIZATION (Left Breast)     Patient location during evaluation: PACU Anesthesia Type: General Level of consciousness: sedated Pain management: pain level controlled Vital Signs Assessment: post-procedure vital signs reviewed and stable Respiratory status: spontaneous breathing and respiratory function stable Cardiovascular status: stable Postop Assessment: no apparent nausea or vomiting Anesthetic complications: no   No complications documented.  Last Vitals:  Vitals:   05/07/21 0845 05/07/21 0913  BP: (!) 154/73 (!) 145/79  Pulse: 94 83  Resp: 15 16  Temp:  36.6 C  SpO2: 94% 99%    Last Pain:  Vitals:   05/07/21 0858  TempSrc:   PainSc: 0-No pain                 Arvine Clayburn DANIEL

## 2021-05-07 NOTE — Op Note (Signed)
Procedures Performed: 1) left breast radioactive seed-localized lumpectomy  Findings: 1) multiple dilated, indurated, retroareolar milk ducts with inspissated contents compatible with mammography findings 2) 2 x 3 cm lumpectomy specimen; oriented and sent for final pathology 3) specimen with one radioactive seed and one biopsy clip identified on X-ray  Indications: 84yoF with complex mammogram, multiple disseminated calcifications and persistent drainage and mass sensation in retroareolar region. Recommended for radioactive seed-localized lumpectomy.  Pre-operative Diagnosis:  1) left breast mass  Post-operative Diagnosis:  1) left breast mass  Surgeon: Dr. Donnie Mesa, MD   Assistants: Dr. Wynelle Link, MD, PGY4  Anesthesia: General LMA anesthesia  ASA Class: 2  Procedure Details   Written informed consent was garnered, the left breast marked, and the patient was taken to the operating room.  There is a gentle induction of general anesthesia with LMA placement by the anesthesia provider team.  She was positioned supine with her arms out on arm boards.  The left breast and chest were prepped broadly using ChloraPreps.  Vancomycin was administered for SSI prophylaxis.  A timeout for safety was called.  We started by making a 3 cm periareolar incision using a #15 scalpel.  We carried our dissection down around the area of maximal counts using the neoprobe with electrocautery.  We circumferentially dissected the region of interest, and then ultimately removed a roughly 2 x 3 cm lumpectomy specimen.  This was radiographed, which demonstrated a single biopsy clip and a single radioactive seed.  The specimen was oriented using paint and sent for final pathology. Findings grossly were multiple dilated milk ducts filled with dense, inspissated material and otherwise unremarkable fatty breast tissue.  There was no residual radioactive activity and lumpectomy cavity.  We copiously irrigated  the cavity, assured hemostasis using electrocautery, and then closed the wound in layers using 3-0 Vicryl in the subcutaneous tissues, and a 4-0 Monocryl suture to reapproximate the dermal epidermal junction.  The wound was dressed with Steri-Strips, benzoin, and gauze and tape.  All instruments, needle, and sponge counts were correct at the conclusion of the procedure.  The patient tolerated the procedure well and was transferred to the PACU in stable condition.     Estimated Blood Loss: Minimal         Drains: None         Specimens: Left breast seed-localized mass           Complications: None; patient tolerated the procedure well.         Disposition: PACU - hemodynamically stable.         Condition: stable

## 2021-05-07 NOTE — Interval H&P Note (Signed)
History and Physical Interval Note:  05/07/2021 7:17 AM  [name]  has presented today for surgery, with the diagnosis of LEFT RETROAREOLAR MASS AND NIPPLE DISCHARGE.  The various methods of treatment have been discussed with the patient and family. After consideration of risks, benefits and other options for treatment, the patient has consented to  Procedure(s): LEFT BREAST LUMPECTOMY WITH RADIOACTIVE SEED LOCALIZATION (Left) as a surgical intervention.  The patient's history has been reviewed, patient examined, no change in status, stable for surgery.  I have reviewed the patient's chart and labs.  Questions were answered to the patient's satisfaction.     Maia Petties

## 2021-05-07 NOTE — Anesthesia Procedure Notes (Signed)
Procedure Name: LMA Insertion Date/Time: 05/07/2021 7:38 AM Performed by: Pawel Soules, Ernesta Amble, CRNA Pre-anesthesia Checklist: Patient identified, Emergency Drugs available, Suction available and Patient being monitored Patient Re-evaluated:Patient Re-evaluated prior to induction Oxygen Delivery Method: Circle System Utilized Preoxygenation: Pre-oxygenation with 100% oxygen Induction Type: IV induction Ventilation: Mask ventilation without difficulty LMA: LMA inserted LMA Size: 4.0 Number of attempts: 1 Airway Equipment and Method: bite block Placement Confirmation: positive ETCO2 Tube secured with: Tape Dental Injury: Teeth and Oropharynx as per pre-operative assessment

## 2021-05-07 NOTE — Discharge Instructions (Signed)
[name] Office Phone Number [phone]  BREAST BIOPSY/ PARTIAL MASTECTOMY: POST OP INSTRUCTIONS  Always review your discharge instruction sheet given to you by the facility where your surgery was performed.  IF YOU HAVE DISABILITY OR FAMILY LEAVE FORMS, YOU MUST BRING THEM TO THE OFFICE FOR PROCESSING.  DO NOT GIVE THEM TO YOUR DOCTOR.  1. A prescription for pain medication may be given to you upon discharge.  Take your pain medication as prescribed, if needed.  If narcotic pain medicine is not needed, then you may take acetaminophen (Tylenol) or ibuprofen (Advil) as needed. 2. Take your usually prescribed medications unless otherwise directed 3. If you need a refill on your pain medication, please contact your pharmacy.  They will contact our office to request authorization.  Prescriptions will not be filled after 5pm or on week-ends. 4. You should eat very light the first 24 hours after surgery, such as soup, crackers, pudding, etc.  Resume your normal diet the day after surgery. 5. Most patients will experience some swelling and bruising in the breast.  Ice packs and a good support bra will help.  Swelling and bruising can take several days to resolve.  6. It is common to experience some constipation if taking pain medication after surgery.  Increasing fluid intake and taking a stool softener will usually help or prevent this problem from occurring.  A mild laxative (Milk of Magnesia or Miralax) should be taken according to package directions if there are no bowel movements after 48 hours. 7. Unless discharge instructions indicate otherwise, you may remove your bandages 24-48 hours after surgery, and you may shower at that time.  You may have steri-strips (small skin tapes) in place directly over the incision.  These strips should be left on the skin for 7-10 days.  If your surgeon used skin glue on the incision, you may shower in 24 hours.  The glue will flake off over the  next 2-3 weeks.  Any sutures or staples will be removed at the office during your follow-up visit. 8. ACTIVITIES:  You may resume regular daily activities (gradually increasing) beginning the next day.  Wearing a good support bra or sports bra minimizes pain and swelling.  You may have sexual intercourse when it is comfortable. a. You may drive when you no longer are taking prescription pain medication, you can comfortably wear a seatbelt, and you can safely maneuver your car and apply brakes. b. RETURN TO WORK:  ______________________________________________________________________________________ 9. You should see your doctor in the office for a follow-up appointment approximately two weeks after your surgery.  Your doctor's nurse will typically make your follow-up appointment when she calls you with your pathology report.  Expect your pathology report 2-3 business days after your surgery.  You may call to check if you do not hear from [place] after three days. 10. OTHER INSTRUCTIONS: _______________________________________________________________________________________________ _____________________________________________________________________________________________________________________________________ _____________________________________________________________________________________________________________________________________ _____________________________________________________________________________________________________________________________________  WHEN TO CALL YOUR DOCTOR: 1. Fever over 101.0 2. Nausea and/or vomiting. 3. Extreme swelling or bruising. 4. Continued bleeding from incision. 5. Increased pain, redness, or drainage from the incision.  The clinic staff is available to answer your questions during regular business hours.  Please don't hesitate to call and ask to speak to one of the nurses for clinical concerns.  If you have a medical emergency, go to the nearest  emergency room or call 911.  A surgeon from [facility] is always on call at the hospital.  For further questions, please visit [email or web address]  Post Anesthesia Home Care Instructions  Activity: Get plenty of rest for the remainder of the day. A responsible individual must stay with you for 24 hours following the procedure.  For the next 24 hours, DO NOT: -Drive a car -Paediatric nurse -Drink alcoholic beverages -Take any medication unless instructed by your physician -Make any legal decisions or sign important papers.  Meals: Start with liquid foods such as gelatin or soup. Progress to regular foods as tolerated. Avoid greasy, spicy, heavy foods. If nausea and/or vomiting occur, drink only clear liquids until the nausea and/or vomiting subsides. Call your physician if vomiting continues.  Special Instructions/Symptoms: Your throat may feel dry or sore from the anesthesia or the breathing tube placed in your throat during surgery. If this causes discomfort, gargle with warm salt water. The discomfort should disappear within 24 hours.

## 2021-05-07 NOTE — Transfer of Care (Signed)
Immediate Anesthesia Transfer of Care Note  Patient: [name]  Procedure(s) Performed: LEFT BREAST LUMPECTOMY WITH RADIOACTIVE SEED LOCALIZATION (Left Breast)  Patient Location: PACU  Anesthesia Type:General  Level of Consciousness: drowsy and patient cooperative  Airway & Oxygen Therapy: Patient Spontanous Breathing and Patient connected to face mask oxygen  Post-op Assessment: Report given to RN and Post -op Vital signs reviewed and stable  Post vital signs: Reviewed and stable  Last Vitals:  Vitals Value Taken Time  BP    Temp    Pulse 91 05/07/21 0819  Resp    SpO2 93 % 05/07/21 0819  Vitals shown include unvalidated device data.  Last Pain:  Vitals:   05/07/21 0289  TempSrc: Oral  PainSc: 0-No pain         Complications: No complications documented.

## 2021-05-08 ENCOUNTER — Encounter (HOSPITAL_BASED_OUTPATIENT_CLINIC_OR_DEPARTMENT_OTHER): Payer: Self-pay | Admitting: Surgery

## 2021-05-09 LAB — SURGICAL PATHOLOGY

## 2021-09-07 DIAGNOSIS — U071 COVID-19: Secondary | ICD-10-CM | POA: Diagnosis not present

## 2021-09-12 IMAGING — MG MM PLC BREAST LOC DEV 1ST LESION INC MAMMO GUIDE*L*
6 series · 6 of 6 positions shown · non-contrast
Comparison: Previous exam(s).

CLINICAL DATA: Patient presents for radioactive seed localization
of a previously biopsied dilated left breast duct prior to surgical
excision.

EXAM:
MAMMOGRAPHIC GUIDED RADIOACTIVE SEED LOCALIZATION OF THE LEFT BREAST

[L CC (1 of 3)]
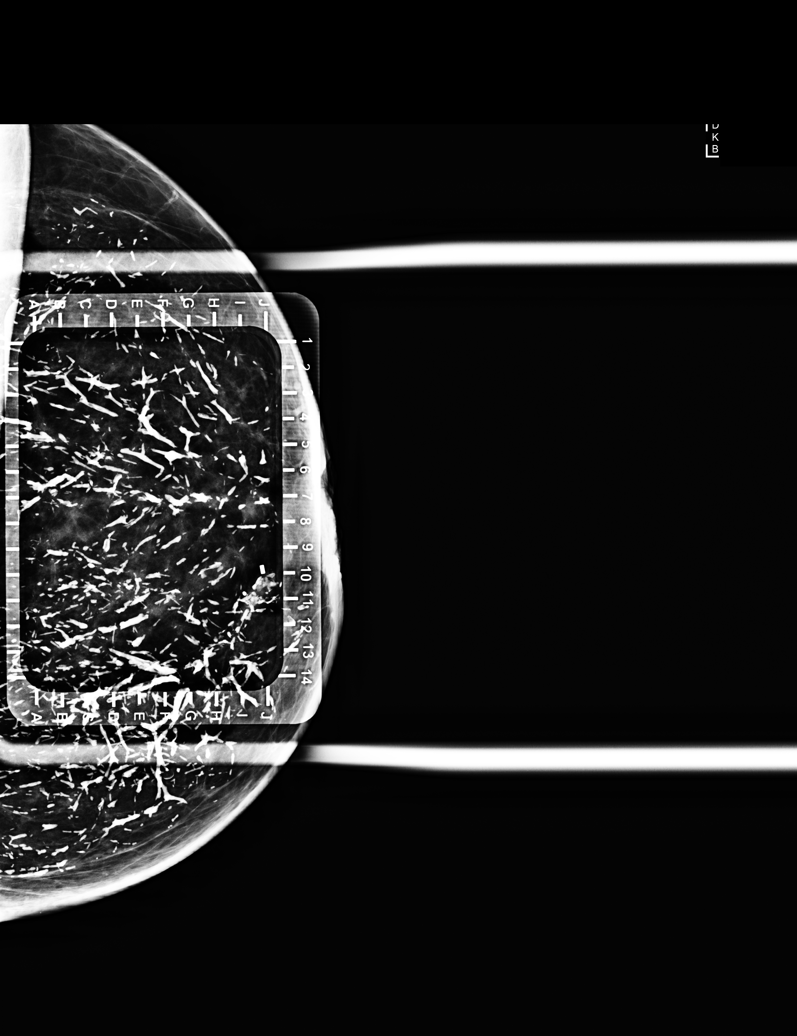

[L CC (2 of 3)]
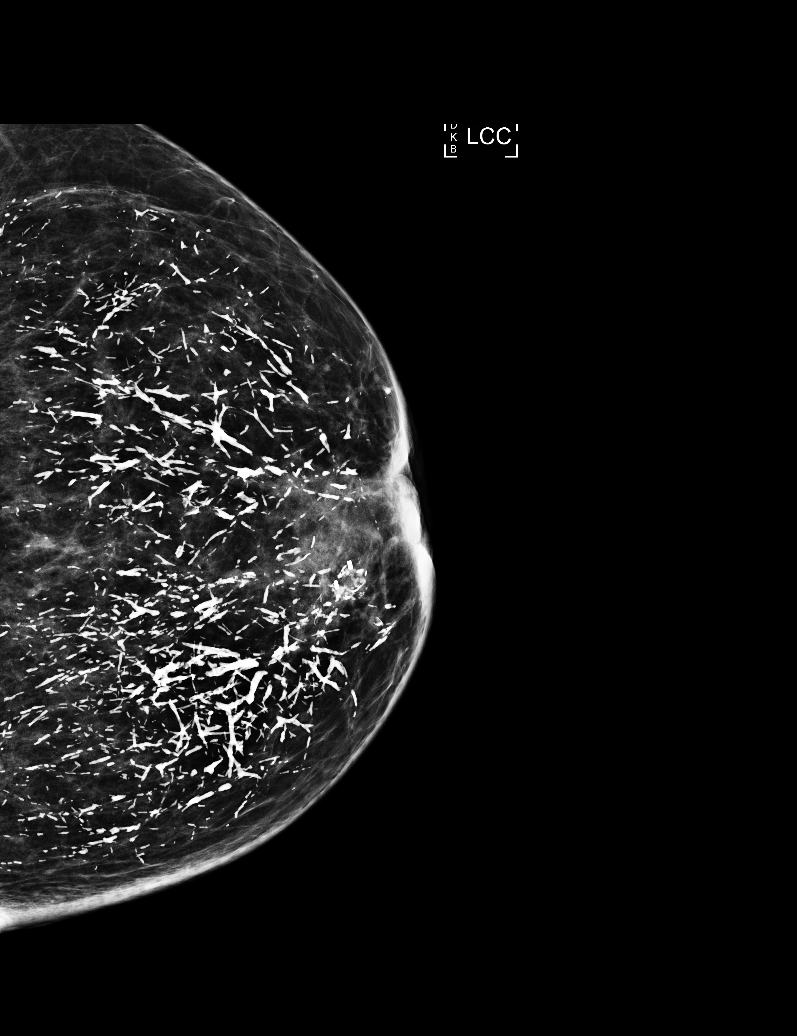

[L ML (1 of 3)]
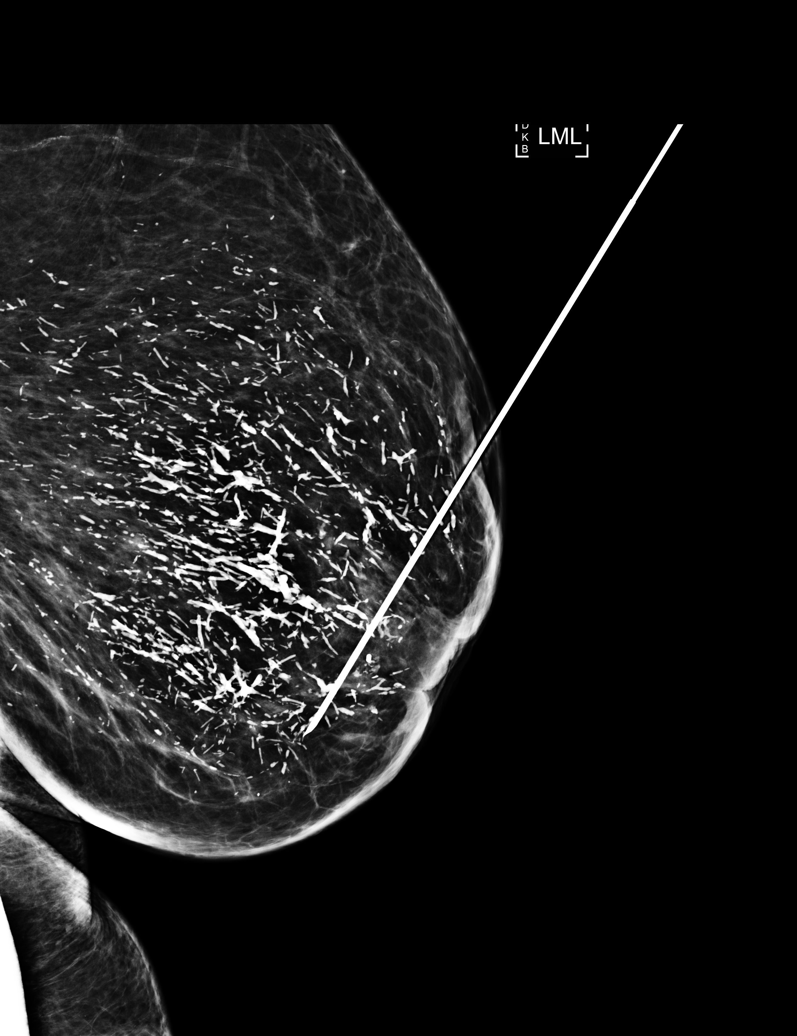

[L CC (3 of 3)]
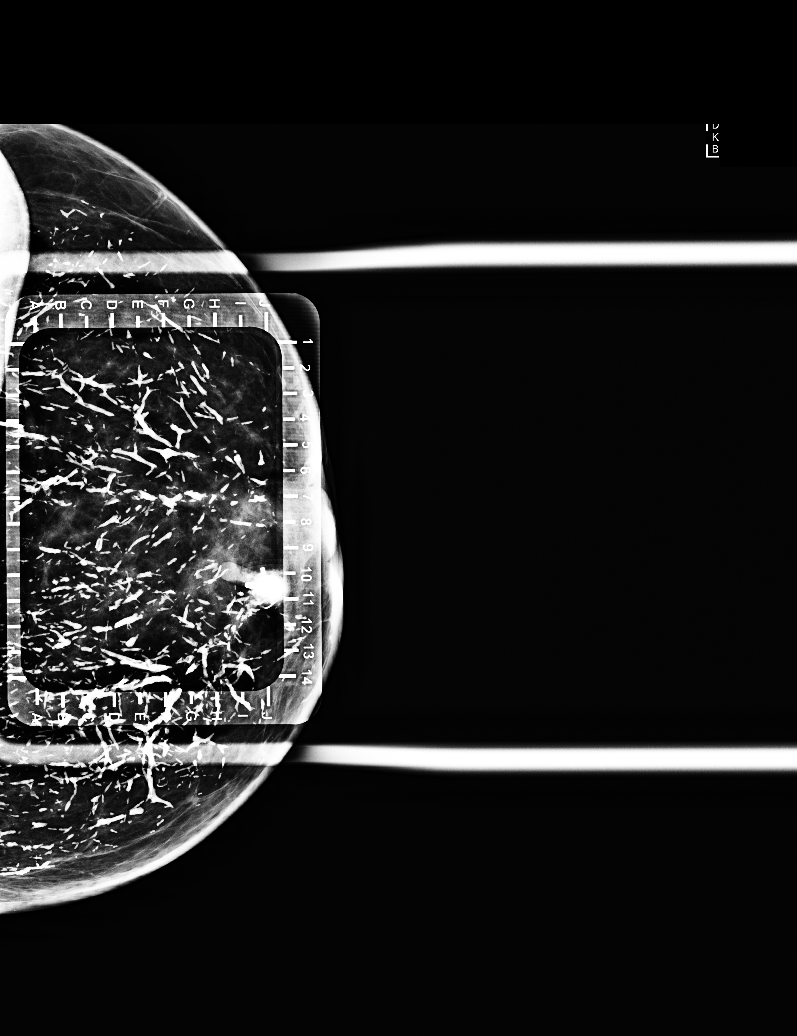

[L ML (2 of 3)]
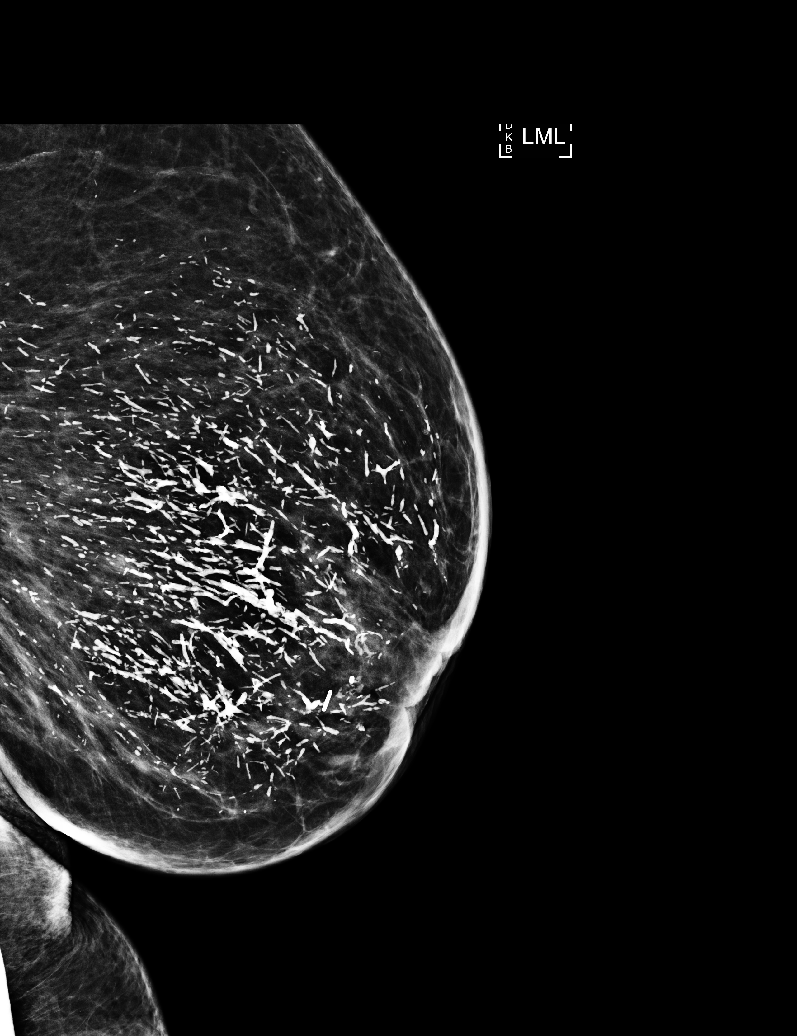

[L ML (3 of 3)]
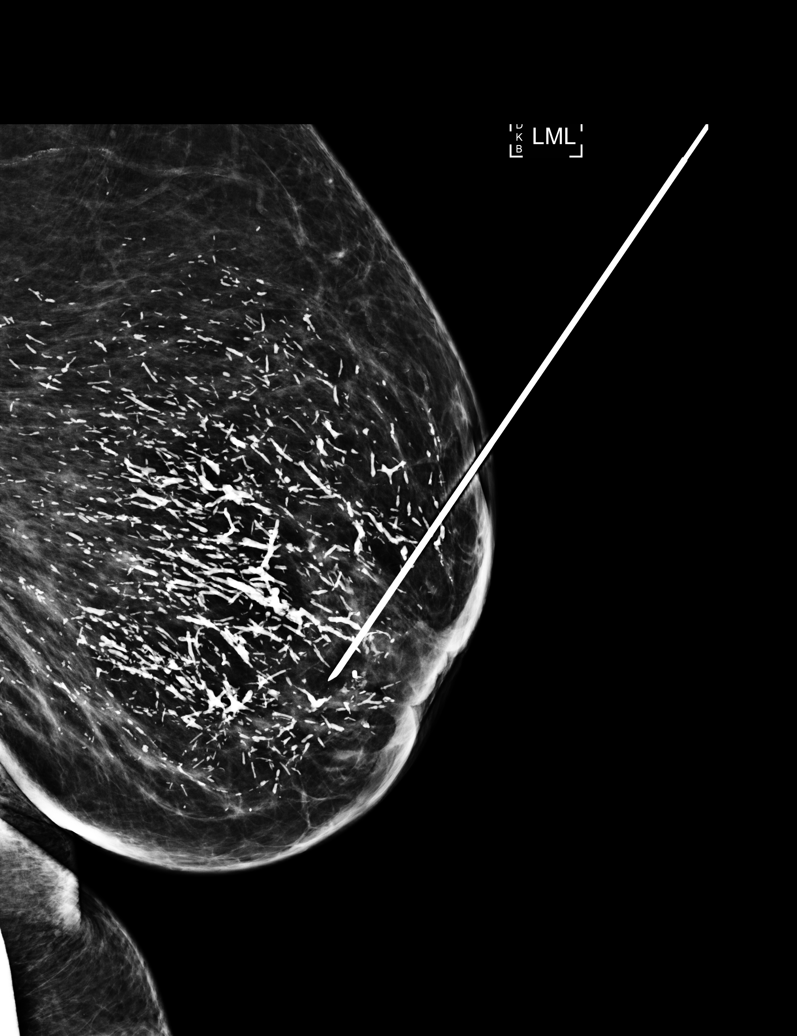

[6 of 6 positions shown; findings below may reference images not displayed]

FINDINGS: Patient presents for radioactive seed localization prior to surgical
excision. I met with the patient and we discussed the procedure of
seed localization including benefits and alternatives. We discussed
the high likelihood of a successful procedure. We discussed the
risks of the procedure including infection, bleeding, tissue injury
and further surgery. We discussed the low dose of radioactivity
involved in the procedure. Informed, written consent was given.

The usual time-out protocol was performed immediately prior to the
procedure.

Using mammographic guidance, sterile technique, 1% lidocaine and an
3-J1A radioactive seed, the cylinder shaped biopsy clip was
localized using a superior approach. The follow-up mammogram images
confirm the seed in the expected location and were marked for Dr.
Terrence.

Follow-up survey of the patient confirms presence of the radioactive
seed.

Order number of 3-J1A seed:  [id number].

Total activity:  0.252 millicuries reference Date: 04/18/2021.

The patient tolerated the procedure well and was released from the
[REDACTED]. She was given instructions regarding seed removal.
IMPRESSION: Radioactive seed localization of the left breast. No apparent
complications.

## 2021-11-11 DIAGNOSIS — Z299 Encounter for prophylactic measures, unspecified: Secondary | ICD-10-CM | POA: Diagnosis not present

## 2021-11-11 DIAGNOSIS — U071 COVID-19: Secondary | ICD-10-CM | POA: Diagnosis not present

## 2021-11-11 DIAGNOSIS — B351 Tinea unguium: Secondary | ICD-10-CM | POA: Diagnosis not present

## 2021-11-11 DIAGNOSIS — Z1331 Encounter for screening for depression: Secondary | ICD-10-CM | POA: Diagnosis not present

## 2021-11-11 DIAGNOSIS — E7800 Pure hypercholesterolemia, unspecified: Secondary | ICD-10-CM | POA: Diagnosis not present

## 2021-12-11 DIAGNOSIS — Z79899 Other long term (current) drug therapy: Secondary | ICD-10-CM | POA: Diagnosis not present

## 2021-12-11 DIAGNOSIS — E039 Hypothyroidism, unspecified: Secondary | ICD-10-CM | POA: Diagnosis not present

## 2021-12-11 DIAGNOSIS — I10 Essential (primary) hypertension: Secondary | ICD-10-CM | POA: Diagnosis not present

## 2021-12-11 DIAGNOSIS — Z713 Dietary counseling and surveillance: Secondary | ICD-10-CM | POA: Diagnosis not present

## 2021-12-11 DIAGNOSIS — E7800 Pure hypercholesterolemia, unspecified: Secondary | ICD-10-CM | POA: Diagnosis not present

## 2021-12-11 DIAGNOSIS — R5383 Other fatigue: Secondary | ICD-10-CM | POA: Diagnosis not present

## 2021-12-11 DIAGNOSIS — Z7189 Other specified counseling: Secondary | ICD-10-CM | POA: Diagnosis not present

## 2021-12-11 DIAGNOSIS — E1165 Type 2 diabetes mellitus with hyperglycemia: Secondary | ICD-10-CM | POA: Diagnosis not present

## 2021-12-11 DIAGNOSIS — E559 Vitamin D deficiency, unspecified: Secondary | ICD-10-CM | POA: Diagnosis not present

## 2021-12-11 DIAGNOSIS — Z789 Other specified health status: Secondary | ICD-10-CM | POA: Diagnosis not present

## 2021-12-11 DIAGNOSIS — Z1339 Encounter for screening examination for other mental health and behavioral disorders: Secondary | ICD-10-CM | POA: Diagnosis not present

## 2021-12-11 DIAGNOSIS — Z0000 Encounter for general adult medical examination without abnormal findings: Secondary | ICD-10-CM | POA: Diagnosis not present

## 2021-12-11 DIAGNOSIS — Z1331 Encounter for screening for depression: Secondary | ICD-10-CM | POA: Diagnosis not present

## 2021-12-11 DIAGNOSIS — Z6830 Body mass index (BMI) 30.0-30.9, adult: Secondary | ICD-10-CM | POA: Diagnosis not present

## 2021-12-23 DIAGNOSIS — Z79899 Other long term (current) drug therapy: Secondary | ICD-10-CM | POA: Diagnosis not present

## 2021-12-23 DIAGNOSIS — M859 Disorder of bone density and structure, unspecified: Secondary | ICD-10-CM | POA: Diagnosis not present

## 2021-12-23 DIAGNOSIS — E2839 Other primary ovarian failure: Secondary | ICD-10-CM | POA: Diagnosis not present

## 2022-03-17 DIAGNOSIS — H43812 Vitreous degeneration, left eye: Secondary | ICD-10-CM | POA: Diagnosis not present

## 2022-03-17 DIAGNOSIS — E119 Type 2 diabetes mellitus without complications: Secondary | ICD-10-CM | POA: Diagnosis not present

## 2022-03-17 DIAGNOSIS — H5213 Myopia, bilateral: Secondary | ICD-10-CM | POA: Diagnosis not present

## 2022-03-17 DIAGNOSIS — D3132 Benign neoplasm of left choroid: Secondary | ICD-10-CM | POA: Diagnosis not present

## 2022-03-17 DIAGNOSIS — Z961 Presence of intraocular lens: Secondary | ICD-10-CM | POA: Diagnosis not present

## 2022-03-19 ENCOUNTER — Encounter (HOSPITAL_COMMUNITY): Payer: Self-pay

## 2022-04-14 DIAGNOSIS — Z299 Encounter for prophylactic measures, unspecified: Secondary | ICD-10-CM | POA: Diagnosis not present

## 2022-04-14 DIAGNOSIS — Z789 Other specified health status: Secondary | ICD-10-CM | POA: Diagnosis not present

## 2022-04-14 DIAGNOSIS — E1165 Type 2 diabetes mellitus with hyperglycemia: Secondary | ICD-10-CM | POA: Diagnosis not present

## 2022-04-14 DIAGNOSIS — I10 Essential (primary) hypertension: Secondary | ICD-10-CM | POA: Diagnosis not present

## 2022-05-18 DIAGNOSIS — Z886 Allergy status to analgesic agent status: Secondary | ICD-10-CM | POA: Diagnosis not present

## 2022-05-18 DIAGNOSIS — S60512A Abrasion of left hand, initial encounter: Secondary | ICD-10-CM | POA: Diagnosis not present

## 2022-05-18 DIAGNOSIS — Z882 Allergy status to sulfonamides status: Secondary | ICD-10-CM | POA: Diagnosis not present

## 2022-05-18 DIAGNOSIS — Z880 Allergy status to penicillin: Secondary | ICD-10-CM | POA: Diagnosis not present

## 2022-05-18 DIAGNOSIS — L299 Pruritus, unspecified: Secondary | ICD-10-CM | POA: Diagnosis not present

## 2022-10-03 DIAGNOSIS — L0390 Cellulitis, unspecified: Secondary | ICD-10-CM | POA: Diagnosis not present

## 2022-10-03 DIAGNOSIS — E1140 Type 2 diabetes mellitus with diabetic neuropathy, unspecified: Secondary | ICD-10-CM | POA: Diagnosis not present

## 2022-10-03 DIAGNOSIS — Z2821 Immunization not carried out because of patient refusal: Secondary | ICD-10-CM | POA: Diagnosis not present

## 2022-10-03 DIAGNOSIS — I10 Essential (primary) hypertension: Secondary | ICD-10-CM | POA: Diagnosis not present

## 2022-10-03 DIAGNOSIS — Z299 Encounter for prophylactic measures, unspecified: Secondary | ICD-10-CM | POA: Diagnosis not present

## 2022-10-10 DIAGNOSIS — Z789 Other specified health status: Secondary | ICD-10-CM | POA: Diagnosis not present

## 2022-10-10 DIAGNOSIS — I10 Essential (primary) hypertension: Secondary | ICD-10-CM | POA: Diagnosis not present

## 2022-10-10 DIAGNOSIS — Z2821 Immunization not carried out because of patient refusal: Secondary | ICD-10-CM | POA: Diagnosis not present

## 2022-10-10 DIAGNOSIS — L0390 Cellulitis, unspecified: Secondary | ICD-10-CM | POA: Diagnosis not present

## 2022-10-10 DIAGNOSIS — Z299 Encounter for prophylactic measures, unspecified: Secondary | ICD-10-CM | POA: Diagnosis not present

## 2022-10-17 DIAGNOSIS — L0390 Cellulitis, unspecified: Secondary | ICD-10-CM | POA: Diagnosis not present

## 2022-10-17 DIAGNOSIS — L309 Dermatitis, unspecified: Secondary | ICD-10-CM | POA: Diagnosis not present

## 2022-10-17 DIAGNOSIS — Z299 Encounter for prophylactic measures, unspecified: Secondary | ICD-10-CM | POA: Diagnosis not present

## 2022-10-17 DIAGNOSIS — I10 Essential (primary) hypertension: Secondary | ICD-10-CM | POA: Diagnosis not present

## 2022-10-28 DIAGNOSIS — Z789 Other specified health status: Secondary | ICD-10-CM | POA: Diagnosis not present

## 2022-10-28 DIAGNOSIS — L0390 Cellulitis, unspecified: Secondary | ICD-10-CM | POA: Diagnosis not present

## 2022-10-28 DIAGNOSIS — L98499 Non-pressure chronic ulcer of skin of other sites with unspecified severity: Secondary | ICD-10-CM | POA: Diagnosis not present

## 2022-10-28 DIAGNOSIS — Z299 Encounter for prophylactic measures, unspecified: Secondary | ICD-10-CM | POA: Diagnosis not present

## 2022-10-28 DIAGNOSIS — I10 Essential (primary) hypertension: Secondary | ICD-10-CM | POA: Diagnosis not present

## 2022-11-07 DIAGNOSIS — I83009 Varicose veins of unspecified lower extremity with ulcer of unspecified site: Secondary | ICD-10-CM | POA: Diagnosis not present

## 2022-11-07 DIAGNOSIS — I10 Essential (primary) hypertension: Secondary | ICD-10-CM | POA: Diagnosis not present

## 2022-11-07 DIAGNOSIS — L97909 Non-pressure chronic ulcer of unspecified part of unspecified lower leg with unspecified severity: Secondary | ICD-10-CM | POA: Diagnosis not present

## 2022-11-07 DIAGNOSIS — Z299 Encounter for prophylactic measures, unspecified: Secondary | ICD-10-CM | POA: Diagnosis not present

## 2022-11-07 DIAGNOSIS — L0390 Cellulitis, unspecified: Secondary | ICD-10-CM | POA: Diagnosis not present

## 2022-11-10 DIAGNOSIS — L97911 Non-pressure chronic ulcer of unspecified part of right lower leg limited to breakdown of skin: Secondary | ICD-10-CM | POA: Diagnosis not present

## 2022-11-16 DIAGNOSIS — L03115 Cellulitis of right lower limb: Secondary | ICD-10-CM | POA: Diagnosis not present

## 2022-11-16 DIAGNOSIS — Z886 Allergy status to analgesic agent status: Secondary | ICD-10-CM | POA: Diagnosis not present

## 2022-11-16 DIAGNOSIS — Z882 Allergy status to sulfonamides status: Secondary | ICD-10-CM | POA: Diagnosis not present

## 2022-11-16 DIAGNOSIS — Z881 Allergy status to other antibiotic agents status: Secondary | ICD-10-CM | POA: Diagnosis not present

## 2022-11-16 DIAGNOSIS — Z880 Allergy status to penicillin: Secondary | ICD-10-CM | POA: Diagnosis not present

## 2022-11-19 ENCOUNTER — Encounter (HOSPITAL_BASED_OUTPATIENT_CLINIC_OR_DEPARTMENT_OTHER): Payer: Medicare Other | Attending: General Surgery | Admitting: General Surgery

## 2022-11-19 DIAGNOSIS — E11622 Type 2 diabetes mellitus with other skin ulcer: Secondary | ICD-10-CM | POA: Insufficient documentation

## 2022-11-19 DIAGNOSIS — L97312 Non-pressure chronic ulcer of right ankle with fat layer exposed: Secondary | ICD-10-CM | POA: Diagnosis not present

## 2022-11-19 DIAGNOSIS — Z833 Family history of diabetes mellitus: Secondary | ICD-10-CM | POA: Diagnosis not present

## 2022-11-19 DIAGNOSIS — L97812 Non-pressure chronic ulcer of other part of right lower leg with fat layer exposed: Secondary | ICD-10-CM | POA: Diagnosis not present

## 2022-11-19 NOTE — Progress Notes (Addendum)
[name], [name] ([id number]) [id number] Page 1 of 9 Visit Report for 11/19/2022 Chief Complaint Document Details Patient Name: Date of Service: [name], [name] 11/19/2022 9:00 A M Medical Record Number: [id number] Patient Account Number: [id number] Date of Birth/Sex: Treating RN: [birth year] (86 y.o. F) Primary Care Provider: Jerene Bears Other Clinician: Referring Provider: Treating Provider/Extender: Fredirick Maudlin HA IRFIELD, KEA V IE Weeks in Treatment: 0 Information Obtained from: Patient Chief Complaint Patient seen for complaints of Non-Healing Wound. Electronic Signature(s) Signed: 11/19/2022 10:10:01 AM By: Fredirick Maudlin MD FACS Entered By: Fredirick Maudlin on 11/19/2022 10:10:01 -------------------------------------------------------------------------------- Debridement Details Patient Name: Date of Service: [name], [name] 11/19/2022 9:00 A M Medical Record Number: [id number] Patient Account Number: [id number] Date of Birth/Sex: Treating RN: Feb 24, 1936 (86 y.o. [name] Primary Care Provider: Jerene Bears Other Clinician: Referring Provider: Treating Provider/Extender: Fredirick Maudlin HA IRFIELD, KEA V IE Weeks in Treatment: 0 Debridement Performed for Assessment: Wound #1 Right Lower Leg Performed By: Physician Fredirick Maudlin, MD Debridement Type: Debridement Severity of Tissue Pre Debridement: Fat layer exposed Level of Consciousness (Pre-procedure): Awake and Alert Pre-procedure Verification/Time Out Yes - 09:45 Taken: Start Time: 09:48 Pain Control: Lidocaine 5% topical ointment T Area Debrided (L x W): otal 2 (cm) x 2 (cm) = 4 (cm) Tissue and other material debrided: Non-Viable, Eschar Level: Non-Viable Tissue Debridement Description: Selective/Open Wound Instrument: Curette Bleeding: Minimum Hemostasis Achieved: Pressure Procedural Pain: 3 Post Procedural Pain: 0 Response to Treatment: Procedure was  tolerated well Level of Consciousness (Post- Awake and Alert procedure): Post Debridement Measurements of Total Wound Length: (cm) 2 Width: (cm) 2 Depth: (cm) 0.2 Volume: (cm) 0.628 Character of Wound/Ulcer Post Debridement: Improved Severity of Tissue Post Debridement: Fat layer exposed [name] ([id number]) [id number] Page 2 of 9 Post Procedure Diagnosis Same as Pre-procedure Notes Scribed for Dr Celine Ahr by Sharyn Creamer, RN Electronic Signature(s) Signed: 11/19/2022 4:06:00 PM By: Sharyn Creamer RN, BSN Signed: 11/19/2022 5:07:18 PM By: Fredirick Maudlin MD FACS Entered By: Sharyn Creamer on 11/19/2022 09:51:08 -------------------------------------------------------------------------------- HPI Details Patient Name: Date of Service: [name], [name] 11/19/2022 9:00 A M Medical Record Number: [id number] Patient Account Number: [id number] Date of Birth/Sex: Treating RN: 05-Jun-1936 (86 y.o. F) Primary Care Provider: Jerene Bears Other Clinician: Referring Provider: Treating Provider/Extender: Fredirick Maudlin HA IRFIELD, KEA V IE Weeks in Treatment: 0 History of Present Illness HPI Description: ADMISSION 11/19/2022 This is an 86 year old woman who was apparently diagnosed with cellulitis in October and placed on doxycycline. She was on doxycycline for over a month. She developed an ulcer on her right ankle. It apparently expanded fairly dramatically over the course of about a week and as a result, she sought treatment at a local emergency room. She was seen on November 16, 2022. At that point, the doxycycline was discontinued and clindamycin prescribed. She was referred to the wound care center for further evaluation and management. She says that since starting the clindamycin, the erythema in her leg has resolved almost completely. She has a triangular wound on her right lower leg, just medial to the anterior tibial surface. There is a thick layer  of eschar on the surface. There is no malodor or purulent drainage. Periwound skin is intact. Electronic Signature(s) Signed: 11/19/2022 10:08:49 AM By: Fredirick Maudlin MD FACS Previous Signature: 11/19/2022 10:02:23 AM Version By: Fredirick Maudlin MD FACS Previous Signature: 11/19/2022 8:55:22 AM Version By: Fredirick Maudlin MD FACS Entered By: Fredirick Maudlin on 11/19/2022 10:08:49 --------------------------------------------------------------------------------  Physical Exam Details Patient Name: Date of Service: [name], [name] 11/19/2022 9:00 A M Medical Record Number: [id number] Patient Account Number: [id number] Date of Birth/Sex: Treating RN: 12-14-1936 (86 y.o. F) Primary Care Provider: Jerene Bears Other Clinician: Referring Provider: Treating Provider/Extender: Fredirick Maudlin HA IRFIELD, KEA V IE Weeks in Treatment: 0 Constitutional Hypertensive, asymptomatic.. . . . No acute distress. Respiratory Normal work of breathing on room air.. Cardiovascular .Marland Kitchen [name], [name] ([id number]) [id number] Page 3 of 9 Notes 11/19/2022: She has a triangular wound on her right lower leg, just medial to the anterior tibial surface. There is a thick layer of eschar on the surface. There is no malodor or purulent drainage. Periwound skin is intact. Electronic Signature(s) Signed: 11/19/2022 10:09:28 AM By: Fredirick Maudlin MD FACS Entered By: Fredirick Maudlin on 11/19/2022 10:09:27 -------------------------------------------------------------------------------- Physician Orders Details Patient Name: Date of Service: [name], [name] 11/19/2022 9:00 A M Medical Record Number: [id number] Patient Account Number: [id number] Date of Birth/Sex: Treating RN: 1936-09-14 (86 y.o. [name] Primary Care Provider: Jerene Bears Other Clinician: Referring Provider: Treating Provider/Extender: Fredirick Maudlin HA IRFIELD, KEA V IE Weeks in Treatment: 0 Verbal /  Phone Orders: No Diagnosis Coding ICD-10 Coding Code Description V61.607 Non-pressure chronic ulcer of right ankle with fat layer exposed Follow-up Appointments ppointment in 1 week. - Dr. Celine Ahr - Room 1 Return A Anesthetic Wound #1 Right Lower Leg (In clinic) Topical Lidocaine 5% applied to wound bed Bathing/ Shower/ Hygiene May shower with protection but do not get wound dressing(s) wet. - may purchase cast protector from [facility], [facility], or [employer] Edema Control - Lymphedema / SCD / Other Right Lower Extremity Elevate legs to the level of the heart or above for 30 minutes daily and/or when sitting, a frequency of: Avoid standing for long periods of time. Wound Treatment Wound #1 - Lower Leg Wound Laterality: Right Cleanser: Soap and Water Discharge Instructions: May shower and wash wound with dial antibacterial soap and water prior to dressing change. Peri-Wound Care: Sween Lotion (Moisturizing lotion) Discharge Instructions: Apply moisturizing lotion as directed Prim Dressing: IODOFLEX 0.9% Cadexomer Iodine Pad 4x6 cm ary Discharge Instructions: Apply to wound bed as instructed Secondary Dressing: ABD Pad, 5x9 Discharge Instructions: Apply over primary dressing as directed. Secondary Dressing: Woven Gauze Sponge, Non-Sterile 4x4 in Discharge Instructions: Apply over primary dressing as directed. Compression Wrap: ThreePress (3 layer compression wrap) Discharge Instructions: Apply three layer compression as directed. Compression Wrap: Surgilast Tubular Elastic Stretch Net Dressing Size 4 Patient Medications llergies: penicillin, acetaminophen, ciprofloxacin, naproxen, Septra, Motrin A Notifications Medication Indication [address] [name], [name] ([id number]) [id number] Page 4 of 9 prior to debridement 11/19/2022 lidocaine DOSE topical 5 % ointment - ointment topical once daily Electronic Signature(s) Signed: 11/19/2022 5:07:18 PM By: Fredirick Maudlin MD FACS Entered By: Fredirick Maudlin on 11/19/2022 10:10:16 -------------------------------------------------------------------------------- Problem List Details Patient Name: Date of Service: [name], [name] 11/19/2022 9:00 A M Medical Record Number: [id number] Patient Account Number: [id number] Date of Birth/Sex: Treating RN: [birth year] (86 y.o. F) Primary Care Provider: Jerene Bears Other Clinician: Referring Provider: Treating Provider/Extender: Fredirick Maudlin HA IRFIELD, KEA V IE Weeks in Treatment: 0 Active Problems ICD-10 Encounter Code Description Active Date MDM Diagnosis L97.312 Non-pressure chronic ulcer of right ankle with fat layer exposed 11/19/2022 No Yes Inactive Problems Resolved Problems Electronic Signature(s) Signed: 11/19/2022 8:54:06 AM By: Fredirick Maudlin MD FACS Entered By: Fredirick Maudlin on 11/19/2022 08:54:05 -------------------------------------------------------------------------------- Progress Note Details Patient  Name: Date of Service: [name], [name] 11/19/2022 9:00 A M Medical Record Number: [id number] Patient Account Number: [id number] Date of Birth/Sex: Treating RN: 22-Sep-1936 (86 y.o. F) Primary Care Provider: Jerene Bears Other Clinician: Referring Provider: Treating Provider/Extender: Fredirick Maudlin HA IRFIELD, KEA V IE Weeks in Treatment: 0 Subjective Chief Complaint Information obtained from Patient Patient seen for complaints of Non-Healing Wound. History of Present Illness (HPI) ADMISSION 11/19/2022 This is an 86 year old woman who was apparently diagnosed with cellulitis in October and placed on doxycycline. She was on doxycycline for over a month. She developed an ulcer on her right ankle. It apparently expanded fairly dramatically over the course of about a week and as a result, she sought treatment at a local emergency room. She was seen on November 16, 2022. At that point, the doxycycline was discontinued and  clindamycin prescribed. She was referred to the wound care center for further evaluation and management. She says that since starting the clindamycin, the erythema in her leg has resolved almost [name], [name] ([id number]) [id number] Page 5 of 9 completely. She has a triangular wound on her right lower leg, just medial to the anterior tibial surface. There is a thick layer of eschar on the surface. There is no malodor or purulent drainage. Periwound skin is intact. Patient History Information obtained from Patient. Allergies penicillin (Severity: Severe), Motrin, ciprofloxacin (Severity: Moderate), naproxen (Severity: Moderate), Septra (Severity: Moderate), acetaminophen (Severity: Severe) Family History Cancer - Maternal Grandparents,Father, Diabetes - Paternal Grandparents,Father, Heart Disease - Mother, Hypertension - Mother, Stroke - Paternal Grandparents, Thyroid Problems - Mother,Child, No family history of Seizures. Social History Never smoker, Marital Status - Divorced, Alcohol Use - Never, Drug Use - No History, Caffeine Use - Daily. Medical History Endocrine Patient has history of Type II Diabetes Musculoskeletal Patient has history of Osteoarthritis Blood sugar is tested. Review of Systems (ROS) Constitutional Symptoms (General Health) Denies complaints or symptoms of Fatigue, Fever, Chills, Marked Weight Change. Eyes Denies complaints or symptoms of Dry Eyes, Vision Changes, Glasses / Contacts. Ear/Nose/Mouth/Throat Denies complaints or symptoms of Chronic sinus problems or rhinitis. Respiratory Denies complaints or symptoms of Chronic or frequent coughs, Shortness of Breath. Cardiovascular Denies complaints or symptoms of Chest pain. Gastrointestinal Denies complaints or symptoms of Frequent diarrhea, Nausea, Vomiting. Endocrine hypothyriod Genitourinary Denies complaints or symptoms of Frequent urination. Integumentary  (Skin) Complains or has symptoms of Wounds - right lower leg. Musculoskeletal Denies complaints or symptoms of Muscle Pain, Muscle Weakness. Neurologic Denies complaints or symptoms of Numbness/parasthesias. Psychiatric Denies complaints or symptoms of Claustrophobia. Objective Constitutional Hypertensive, asymptomatic.Marland Kitchen No acute distress. Vitals Time Taken: 9:06 AM, Temperature: 98.1 F, Pulse: 93 bpm, Respiratory Rate: 18 breaths/min, Blood Pressure: 191/77 mmHg, Capillary Blood Glucose: 148 mg/dl. Respiratory Normal work of breathing on room air.. General Notes: 11/19/2022: She has a triangular wound on her right lower leg, just medial to the anterior tibial surface. There is a thick layer of eschar on the surface. There is no malodor or purulent drainage. Periwound skin is intact. Integumentary (Hair, Skin) Wound #1 status is Open. Original cause of wound was Skin T ear/Laceration. The date acquired was: 11/19/2022. The wound is located on the Right Lower Leg. The wound measures 2cm length x 2cm width x 0.2cm depth; 3.142cm^2 area and 0.628cm^3 volume. There is Fat Layer (Subcutaneous Tissue) exposed. There is no tunneling or undermining noted. There is a medium amount of serosanguineous drainage noted. The wound margin is distinct with the outline attached to the  wound base. There is no granulation within the wound bed. There is a large (67-100%) amount of necrotic tissue within the wound bed including Eschar. The periwound skin appearance exhibited: Erythema. The surrounding wound skin color is noted with erythema. [name], [name] ([id number]) [id number] Page 6 of 9 Assessment Active Problems ICD-10 Non-pressure chronic ulcer of right ankle with fat layer exposed Procedures Wound #1 Pre-procedure diagnosis of Wound #1 is a Diabetic Wound/Ulcer of the Lower Extremity located on the Right Lower Leg .Severity of Tissue Pre Debridement is: Fat layer  exposed. There was a Selective/Open Wound Non-Viable Tissue Debridement with a total area of 4 sq cm performed by Fredirick Maudlin, MD. With the following instrument(s): Curette to remove Non-Viable tissue/material. Material removed includes Eschar after achieving pain control using Lidocaine 5% topical ointment. No specimens were taken. A time out was conducted at 09:45, prior to the start of the procedure. A Minimum amount of bleeding was controlled with Pressure. The procedure was tolerated well with a pain level of 3 throughout and a pain level of 0 following the procedure. Post Debridement Measurements: 2cm length x 2cm width x 0.2cm depth; 0.628cm^3 volume. Character of Wound/Ulcer Post Debridement is improved. Severity of Tissue Post Debridement is: Fat layer exposed. Post procedure Diagnosis Wound #1: Same as Pre-Procedure General Notes: Scribed for Dr Celine Ahr by Sharyn Creamer, RN. Pre-procedure diagnosis of Wound #1 is a Diabetic Wound/Ulcer of the Lower Extremity located on the Right Lower Leg . There was a Three Layer Compression Therapy Procedure by Sharyn Creamer, RN. Post procedure Diagnosis Wound #1: Same as Pre-Procedure Plan Follow-up Appointments: Return Appointment in 1 week. - Dr. Celine Ahr - Room 1 Anesthetic: Wound #1 Right Lower Leg: (In clinic) Topical Lidocaine 5% applied to wound bed Bathing/ Shower/ Hygiene: May shower with protection but do not get wound dressing(s) wet. - may purchase cast protector from [facility], [facility], or [employer] Edema Control - Lymphedema / SCD / Other: Elevate legs to the level of the heart or above for 30 minutes daily and/or when sitting, a frequency of: Avoid standing for long periods of time. The following medication(s) was prescribed: lidocaine topical 5 % ointment ointment topical once daily for prior to debridement was prescribed at facility WOUND #1: - Lower Leg Wound Laterality: Right Cleanser: Soap and Water Discharge Instructions: May  shower and wash wound with dial antibacterial soap and water prior to dressing change. Peri-Wound Care: Sween Lotion (Moisturizing lotion) Discharge Instructions: Apply moisturizing lotion as directed Prim Dressing: IODOFLEX 0.9% Cadexomer Iodine Pad 4x6 cm ary Discharge Instructions: Apply to wound bed as instructed Secondary Dressing: ABD Pad, 5x9 Discharge Instructions: Apply over primary dressing as directed. Secondary Dressing: Woven Gauze Sponge, Non-Sterile 4x4 in Discharge Instructions: Apply over primary dressing as directed. Com pression Wrap: ThreePress (3 layer compression wrap) Discharge Instructions: Apply three layer compression as directed. Com pression Wrap: Surgilast Tubular Elastic Stretch Net Dressing Size 4 11/19/2022: This is an 86 year old woman who developed cellulitis in October that she thinks may have been due to a small scratch on her leg. This progressed to an ulcer that enlarged over time and was not responsive to doxycycline. She has been on clindamycin now since Sunday and it is showing signs of improvement. She has a triangular wound on her right lower leg, just medial to the anterior tibial surface. There is a thick layer of eschar on the surface. There is no malodor or purulent drainage. Periwound skin is intact. I used a curette to debride slough  and eschar from the wound. I think she will benefit from ongoing chemical debridement so we will apply Iodoflex with 3 layer compression. She will complete the course of oral clindamycin. Follow-up in 1 week. Electronic Signature(s) Signed: 11/19/2022 10:14:38 AM By: Fredirick Maudlin MD FACS Entered By: Fredirick Maudlin on 11/19/2022 10:14:38 [name] ([id number]) [id number] Page 7 of 9 -------------------------------------------------------------------------------- HxROS Details Patient Name: Date of Service: [name], [name] 11/19/2022 9:00 A M Medical Record Number:  [id number] Patient Account Number: [id number] Date of Birth/Sex: Treating RN: January 10, 1936 (86 y.o. [name] Primary Care Provider: Jerene Bears Other Clinician: Referring Provider: Treating Provider/Extender: Fredirick Maudlin HA IRFIELD, KEA V IE Weeks in Treatment: 0 Information Obtained From Patient Constitutional Symptoms (General Health) Complaints and Symptoms: Negative for: Fatigue; Fever; Chills; Marked Weight Change Eyes Complaints and Symptoms: Negative for: Dry Eyes; Vision Changes; Glasses / Contacts Ear/Nose/Mouth/Throat Complaints and Symptoms: Negative for: Chronic sinus problems or rhinitis Respiratory Complaints and Symptoms: Negative for: Chronic or frequent coughs; Shortness of Breath Cardiovascular Complaints and Symptoms: Negative for: Chest pain Gastrointestinal Complaints and Symptoms: Negative for: Frequent diarrhea; Nausea; Vomiting Genitourinary Complaints and Symptoms: Negative for: Frequent urination Integumentary (Skin) Complaints and Symptoms: Positive for: Wounds - right lower leg Musculoskeletal Complaints and Symptoms: Negative for: Muscle Pain; Muscle Weakness Medical History: Positive for: Osteoarthritis Neurologic Complaints and Symptoms: Negative for: Numbness/parasthesias Psychiatric Complaints and Symptoms: Negative for: Claustrophobia Hematologic/Lymphatic [name], [name] ([id number]) [id number] Page 8 of 9 Endocrine Complaints and Symptoms: Review of System Notes: hypothyriod Medical History: Positive for: Type II Diabetes Time with diabetes: 30 yrs ago Blood sugar tested every day: Yes Tested : Immunological Oncologic Immunizations Pneumococcal Vaccine: Received Pneumococcal Vaccination: No Implantable Devices None Family and Social History Cancer: Yes - Maternal Grandparents,Father; Diabetes: Yes - Paternal Grandparents,Father; Heart Disease: Yes - Mother; Hypertension: Yes -  Mother; Seizures: No; Stroke: Yes - Paternal Grandparents; Thyroid Problems: Yes - Mother,Child; Never smoker; Marital Status - Divorced; Alcohol Use: Never; Drug Use: No History; Caffeine Use: Daily; Financial Concerns: No; Food, Clothing or Shelter Needs: No; Support System Lacking: No; Transportation Concerns: No Engineer, maintenance) Signed: 11/19/2022 4:06:00 PM By: Sharyn Creamer RN, BSN Signed: 11/19/2022 5:07:18 PM By: Fredirick Maudlin MD FACS Entered By: Sharyn Creamer on 11/19/2022 09:17:36 -------------------------------------------------------------------------------- SuperBill Details Patient Name: Date of Service: [name], A Iona Beard Gonzalez. 11/19/2022 Medical Record Number: [id number] Patient Account Number: [id number] Date of Birth/Sex: Treating RN: 20-Oct-1936 (86 y.o. F) Primary Care Provider: Jerene Bears Other Clinician: Referring Provider: Treating Provider/Extender: Fredirick Maudlin HA IRFIELD, KEA V IE Weeks in Treatment: 0 Diagnosis Coding ICD-10 Codes Code Description N86.767 Non-pressure chronic ulcer of right ankle with fat layer exposed Facility Procedures : 7 CPT4 Code: [id number] Description: 99213 - WOUND CARE VISIT-LEV 3 EST PT Modifier: 25 Quantity: 1 : 7 CPT4 Code: 6283662 Description: 94765 - DEBRIDE WOUND 1ST 20 SQ CM OR < ICD-10 Diagnosis Description Y65.035 Non-pressure chronic ulcer of right ankle with fat layer exposed Modifier: Quantity: 1 Physician Procedures : CPT4 Code Description Modifier [id number] - WC PHYS LEVEL 4 - NEW PT 25 ICD-10 Diagnosis Description Y17.494 Non-pressure chronic ulcer of right ankle with fat layer exposed [name], [name] ([id number]) [id number]  97597 - WC PHYS DEBR WO ANESTH 20 SQ CM 1 ICD-10 Diagnosis Description L97.312 Non-pressure chronic ulcer of right ankle with fat layer exposed Quantity: 1 27.pdf Page 9 of 9 Electronic Signature(s) Signed: 11/19/2022 4:06:00 PM By: Sharyn Creamer RN,  BSN Signed:  11/19/2022 5:07:18 PM By: Fredirick Maudlin MD FACS Previous Signature: 11/19/2022 10:14:51 AM Version By: Fredirick Maudlin MD FACS Entered By: Sharyn Creamer on 11/19/2022 10:30:25

## 2022-11-20 DIAGNOSIS — L98499 Non-pressure chronic ulcer of skin of other sites with unspecified severity: Secondary | ICD-10-CM | POA: Diagnosis not present

## 2022-11-20 DIAGNOSIS — M8580 Other specified disorders of bone density and structure, unspecified site: Secondary | ICD-10-CM | POA: Diagnosis not present

## 2022-11-20 DIAGNOSIS — Z1331 Encounter for screening for depression: Secondary | ICD-10-CM | POA: Diagnosis not present

## 2022-11-20 DIAGNOSIS — I10 Essential (primary) hypertension: Secondary | ICD-10-CM | POA: Diagnosis not present

## 2022-11-20 DIAGNOSIS — E039 Hypothyroidism, unspecified: Secondary | ICD-10-CM | POA: Diagnosis not present

## 2022-11-20 DIAGNOSIS — E785 Hyperlipidemia, unspecified: Secondary | ICD-10-CM | POA: Diagnosis not present

## 2022-11-20 DIAGNOSIS — Z7189 Other specified counseling: Secondary | ICD-10-CM | POA: Diagnosis not present

## 2022-11-20 DIAGNOSIS — Z789 Other specified health status: Secondary | ICD-10-CM | POA: Diagnosis not present

## 2022-11-20 DIAGNOSIS — Z6829 Body mass index (BMI) 29.0-29.9, adult: Secondary | ICD-10-CM | POA: Diagnosis not present

## 2022-11-20 DIAGNOSIS — Z1339 Encounter for screening examination for other mental health and behavioral disorders: Secondary | ICD-10-CM | POA: Diagnosis not present

## 2022-11-20 DIAGNOSIS — R5383 Other fatigue: Secondary | ICD-10-CM | POA: Diagnosis not present

## 2022-11-20 DIAGNOSIS — Z299 Encounter for prophylactic measures, unspecified: Secondary | ICD-10-CM | POA: Diagnosis not present

## 2022-11-20 DIAGNOSIS — Z0000 Encounter for general adult medical examination without abnormal findings: Secondary | ICD-10-CM | POA: Diagnosis not present

## 2022-11-20 DIAGNOSIS — Z79899 Other long term (current) drug therapy: Secondary | ICD-10-CM | POA: Diagnosis not present

## 2022-11-20 NOTE — Progress Notes (Signed)
REBECKA, OELKERS ([id number]) [id number] Page 1 of 8 Visit Report for 11/19/2022 Allergy List Details Patient Name: Date of Service: [name], [name]. 11/19/2022 9:00 [name] Medical Record Number: [id number] Patient Account Number: [id number] Date of Birth/Sex: Treating RN: [birth year] (86 y.o. [name] Primary Care [name]: [name] Other Clinician: Referring [name]: Treating [name]/Extender: [name] [name] Weeks in Treatment: 0 Allergies Active Allergies penicillin Severity: Severe Motrin ciprofloxacin Severity: Moderate naproxen Severity: Moderate Septra Severity: Moderate acetaminophen Severity: Severe Allergy Notes Electronic Signature(s) Signed: 11/19/2022 4:06:00 PM By: Sharyn Creamer RN, BSN Entered By: Sharyn Creamer on 11/19/2022 09:06:23 -------------------------------------------------------------------------------- Arrival Information Details Patient Name: Date of Service: [name], [name]. 11/19/2022 9:00 [name] Medical Record Number: [id number] Patient Account Number: [id number] Date of Birth/Sex: Treating RN: [birth year] (86 y.o. [name] Primary Care Yasmin Dibello: [name] Other Clinician: Referring [name]: Treating [name]/Extender: [name] Deborah IRFIELD, [name] Gonzalez Weeks in Treatment: 0 Visit Information Patient Arrived: Ambulatory Arrival Time: 08:50 Accompanied By: self Transfer Assistance: None Patient Identification Verified: Yes Secondary Verification Process Completed: Yes Electronic Signature(s) Signed: 11/19/2022 4:06:00 PM By: Sharyn Creamer RN, BSN Entered By: Sharyn Creamer on 11/19/2022 09:02:17 [name] ([id number]) [id number] Page 2 of 8 -------------------------------------------------------------------------------- Clinic Level of Care Assessment Details Patient Name: Date of Service: [name], [name]. 11/19/2022 9:00 Deborah  Gonzalez Medical Record Number: [id number] Patient Account Number: [id number] Date of Birth/Sex: Treating RN: [name] (86 y.o. [name] Primary Care Axten Pascucci: [name] Other Clinician: Referring Carlota Philley: Treating Rocky Gladden/Extender: [name] [name] Weeks in Treatment: 0 Clinic Level of Care Assessment Items TOOL 1 Quantity Score X- 1 0 Use when EandM and Procedure is performed on INITIAL visit ASSESSMENTS - Nursing Assessment / Reassessment X- 1 20 General Physical Exam (combine w/ comprehensive assessment (listed just below) when performed on new pt. evals) X- 1 25 Comprehensive Assessment (HX, ROS, Risk Assessments, Wounds Hx, etc.) ASSESSMENTS - Wound and Skin Assessment / Reassessment '[]'$  - 0 Dermatologic / Skin Assessment (not related to wound area) ASSESSMENTS - Ostomy and/or Continence Assessment and Care '[]'$  - 0 Incontinence Assessment and Management '[]'$  - 0 Ostomy Care Assessment and Management (repouching, etc.) PROCESS - Coordination of Care X - Simple Patient / Family Education for ongoing care 1 15 '[]'$  - 0 Complex (extensive) Patient / Family Education for ongoing care X- 1 10 Staff obtains Programmer, systems, Records, T Results / Process Orders est '[]'$  - 0 Staff telephones HHA, Nursing Homes / Clarify orders / etc '[]'$  - 0 Routine Transfer to another Facility (non-emergent condition) '[]'$  - 0 Routine Hospital Admission (non-emergent condition) X- 1 15 New Admissions / Biomedical engineer / Ordering NPWT Apligraf, etc. , '[]'$  - 0 Emergency Hospital Admission (emergent condition) PROCESS - Special Needs '[]'$  - 0 Pediatric / Minor Patient Management '[]'$  - 0 Isolation Patient Management '[]'$  - 0 Hearing / Language / Visual special needs '[]'$  - 0 Assessment of Community assistance (transportation, Gonzalez/C planning, etc.) '[]'$  - 0 Additional assistance / Altered mentation '[]'$  - 0 Support Surface(s) Assessment (bed, cushion, seat, etc.) INTERVENTIONS -  Miscellaneous '[]'$  - 0 External ear exam '[]'$  - 0 Patient Transfer (multiple staff / Civil Service fast streamer / Similar devices) '[]'$  - 0 Simple Staple / Suture removal (25 or less) '[]'$  - 0 Complex Staple / Suture removal (26 or more) '[]'$  - 0 Hypo/Hyperglycemic Management (do not check if billed separately) X- 1  15 Ankle / Brachial Index (ABI) - do not check if billed separately Has the patient been seen at the hospital within the last three years: Yes Total Score: 100 Level Of Care: New/Established - [address] [name], [name] ([id number]) [id number] Page 3 of 8 Electronic Signature(s) Signed: 11/19/2022 4:06:00 PM By: Sharyn Creamer RN, BSN Entered By: Sharyn Creamer on 11/19/2022 10:29:48 -------------------------------------------------------------------------------- Compression Therapy Details Patient Name: Date of Service: [name], [name]. 11/19/2022 9:00 [name] Medical Record Number: [id number] Patient Account Number: [id number] Date of Birth/Sex: Treating RN: [birth year] Gonzalez (86 y.o. [name] Primary Care Eliza Grissinger: [name] Other Clinician: Referring Abdulahad Mederos: Treating [name]/Extender: [name] [name] Weeks in Treatment: 0 Compression Therapy Performed for Wound Assessment: Wound #1 Right Lower Leg Performed By: Clinician Sharyn Creamer, RN Compression Type: Three Layer Post Procedure Diagnosis Same as Pre-procedure Electronic Signature(s) Signed: 11/19/2022 4:06:00 PM By: Sharyn Creamer RN, BSN Entered By: Sharyn Creamer on 11/19/2022 09:59:32 -------------------------------------------------------------------------------- Encounter Discharge Information Details Patient Name: Date of Service: [name], [name]. 11/19/2022 9:00 [name] Medical Record Number: [id number] Patient Account Number: [id number] Date of Birth/Sex: Treating RN: [birth year] Gonzalez (86 y.o. [name] Primary Care Raphaela Cannaday: [name] Other Clinician: Referring  Roxie Kreeger: Treating Talin Rozeboom/Extender: [name] [name] Weeks in Treatment: 0 Encounter Discharge Information Items Post Procedure Vitals Discharge Condition: Stable Temperature (F): 98.1 Ambulatory Status: Ambulatory Pulse (bpm): 93 Discharge Destination: Home Respiratory Rate (breaths/min): 18 Transportation: Private Auto Blood Pressure (mmHg): 191/77 Accompanied By: self Schedule Follow-up Appointment: Yes Clinical Summary of Care: Patient Declined Electronic Signature(s) Signed: 11/19/2022 4:06:00 PM By: Sharyn Creamer RN, BSN Entered By: Sharyn Creamer on 11/19/2022 10:14:36 -------------------------------------------------------------------------------- Lower Extremity Assessment Details Patient Name: Date of Service: [place] Gonzalez, [name]. 11/19/2022 9:00 [name] Medical Record Number: [id number] Patient Account Number: [id number] [name], [name] ([id number]) [id number] Page 4 of 8 Date of Birth/Sex: Treating RN: [birth year] (86 y.o. [name] Primary Care Brendt Dible: Other Clinician: Jerene Gonzalez Referring Ashlley Booher: Treating Cali Hope/Extender: [name] [name] Weeks in Treatment: 0 Edema Assessment Assessed: [Left: No] [Right: Yes] [Left: Edema] [Right: :] Calf Left: Right: Point of Measurement: 28 cm From Medial Instep 34.4 cm Ankle Left: Right: Point of Measurement: 11 cm From Medial Instep 20.3 cm Knee To Floor Left: Right: From Medial Instep 38 cm Vascular Assessment Pulses: Dorsalis Pedis Palpable: [Right:Yes] Electronic Signature(s) Signed: 11/19/2022 4:06:00 PM By: Sharyn Creamer RN, BSN Entered By: Sharyn Creamer on 11/19/2022 09:22:25 -------------------------------------------------------------------------------- Multi Wound Chart Details Patient Name: Date of Service: [name], [name]. 11/19/2022 9:00 [name] Medical Record Number: [id number] Patient Account Number: [id number] Date of  Birth/Sex: Treating RN: [birth year] (86 y.o. F) Primary Care Quana Chamberlain: [name] Other Clinician: Referring Olon Russ: Treating Cherylee Rawlinson/Extender: [name] [name] Weeks in Treatment: 0 Vital Signs Height(in): Capillary Blood Glucose(mg/dl): 148 Weight(lbs): Pulse(bpm): 93 Body Mass Index(BMI): Blood Pressure(mmHg): 191/77 Temperature(F): 98.1 Respiratory Rate(breaths/min): 18 [1:Photos:] [N/Deborah:N/Deborah] Right Lower Leg N/Deborah N/Deborah Wound Location: Skin Tear/Laceration N/Deborah N/Deborah Wounding Event: Diabetic Wound/Ulcer of the Lower N/Deborah N/Deborah Primary Etiology: Extremity Type II Diabetes, Osteoarthritis N/Deborah N/Deborah Comorbid HistoryCARRYE, [name] ([id number]) [id number] Page 5 of 8 11/19/2022 N/Deborah N/Deborah Date Acquired: 0 N/Deborah N/Deborah Weeks of Treatment: Open N/Deborah N/Deborah Wound Status: No N/Deborah N/Deborah Wound Recurrence: 2x2x0.2 N/Deborah N/Deborah Measurements L x W x Gonzalez (cm) 3.142 N/Deborah N/Deborah Deborah (cm) : rea 0.628 N/Deborah  N/Deborah Volume (cm) : Grade 2 N/Deborah N/Deborah Classification: Medium N/Deborah N/Deborah Exudate Deborah mount: Serosanguineous N/Deborah N/Deborah Exudate Type: red, brown N/Deborah N/Deborah Exudate Color: Distinct, outline attached N/Deborah N/Deborah Wound Margin: None Present (0%) N/Deborah N/Deborah Granulation Deborah mount: Large (67-100%) N/Deborah N/Deborah Necrotic Deborah mount: Eschar N/Deborah N/Deborah Necrotic Tissue: Fat Layer (Subcutaneous Tissue): Yes N/Deborah N/Deborah Exposed Structures: Fascia: No Tendon: No Muscle: No Joint: No Bone: No None N/Deborah N/Deborah Epithelialization: Debridement - Selective/Open Wound N/Deborah N/Deborah Debridement: Pre-procedure Verification/Time Out 09:45 N/Deborah N/Deborah Taken: Lidocaine 5% topical ointment N/Deborah N/Deborah Pain Control: Necrotic/Eschar N/Deborah N/Deborah Tissue Debrided: Non-Viable Tissue N/Deborah N/Deborah Level: 4 N/Deborah N/Deborah Debridement Deborah (sq cm): rea Curette N/Deborah N/Deborah Instrument: Minimum N/Deborah N/Deborah Bleeding: Pressure N/Deborah N/Deborah Hemostasis Deborah chieved: 3 N/Deborah N/Deborah Procedural Pain: 0 N/Deborah N/Deborah Post Procedural Pain: Procedure was tolerated well N/Deborah  N/Deborah Debridement Treatment Response: 2x2x0.2 N/Deborah N/Deborah Post Debridement Measurements L x W x Gonzalez (cm) 0.628 N/Deborah N/Deborah Post Debridement Volume: (cm) Erythema: Yes N/Deborah N/Deborah Periwound Skin Color: Compression Therapy N/Deborah N/Deborah Procedures Performed: Debridement Treatment Notes Wound #1 (Lower Leg) Wound Laterality: Right Cleanser Soap and Water Discharge Instruction: May shower and wash wound with dial antibacterial soap and water prior to dressing change. Peri-Wound Care Sween Lotion (Moisturizing lotion) Discharge Instruction: Apply moisturizing lotion as directed Topical Primary Dressing IODOFLEX 0.9% Cadexomer Iodine Pad 4x6 cm Discharge Instruction: Apply to wound bed as instructed Secondary Dressing ABD Pad, 5x9 Discharge Instruction: Apply over primary dressing as directed. Woven Gauze Sponge, Non-Sterile 4x4 in Discharge Instruction: Apply over primary dressing as directed. Secured With Compression Wrap ThreePress (3 layer compression wrap) Discharge Instruction: Apply three layer compression as directed. Surgilast Tubular Elastic Stretch Net Dressing Size 4 Compression Stockings Add-Ons Electronic Signature(s) Signed: 11/19/2022 10:09:49 AM By: Deborah Maudlin MD FACS [name], [name] ([id number]) AM By: Deborah Maudlin MD FACS [phone].pdf Page 6 of 8 Signed: 11/19/2022 10:09:49 Entered By: [name] on 11/19/2022 10:09:49 -------------------------------------------------------------------------------- Multi-Disciplinary Care Plan Details Patient Name: Date of Service: [name], [name]. 11/19/2022 9:00 [name] Medical Record Number: [id number] Patient Account Number: [id number] Date of Birth/Sex: Treating RN: [birth year] (86 y.o. [name] Primary Care Ria Redcay: [name] Other Clinician: Referring Lezly Rumpf: Treating Jadee Golebiewski/Extender: [name] [name] Weeks in Treatment: 0 Active Inactive Wound/Skin  Impairment Nursing Diagnoses: Impaired tissue integrity Knowledge deficit related to ulceration/compromised skin integrity Goals: Patient/caregiver will verbalize understanding of skin care regimen Date Initiated: 11/19/2022 Target Resolution Date: 12/24/2022 Goal Status: Active Ulcer/skin breakdown will have Deborah volume reduction of 30% by week 4 Date Initiated: 11/19/2022 Target Resolution Date: 12/31/2022 Goal Status: Active Interventions: Assess patient/caregiver ability to obtain necessary supplies Assess patient/caregiver ability to perform ulcer/skin care regimen upon admission and as needed Assess ulceration(s) every visit Provide education on ulcer and skin care Notes: Electronic Signature(s) Signed: 11/19/2022 4:06:00 PM By: Sharyn Creamer RN, BSN Entered By: Sharyn Creamer on 11/19/2022 09:53:13 -------------------------------------------------------------------------------- Patient/Caregiver Education Details Patient Name: Date of Service: [name], Deborah [place]. 12/6/2023andnbsp9:00 [place] Record Number: [id number] Patient Account Number: [id number] Date of Birth/Gender: Treating RN: [birth year] (86 y.o. [name] Primary Care Physician: [name] Other Clinician: Referring Physician: Treating Physician/Extender: [name] Deborah IRFIELD, [name] Gonzalez Weeks in Treatment: 0 Education Assessment Education Provided To: Patient Education Topics Provided Wound/Skin ImpairmentMANIAH, [name] ([id number]) [id number] Page 7 of 8 Methods: Explain/Verbal Responses: State content correctly Electronic Signature(s) Signed: 11/19/2022 4:06:00 PM By: Sharyn Creamer  RN, BSN Entered By: Sharyn Creamer on 11/19/2022 09:59:50 -------------------------------------------------------------------------------- Wound Assessment Details Patient Name: Date of Service: [name], [name]. 11/19/2022 9:00 [name] Medical Record Number: [id number] Patient  Account Number: [id number] Date of Birth/Sex: Treating RN: [birth year] (86 y.o. [name] Primary Care Quadarius Henton: [name] Other Clinician: Referring Bee Hammerschmidt: Treating Django Nguyen/Extender: [name] [name] Weeks in Treatment: 0 Wound Status Wound Number: 1 Primary Etiology: Diabetic Wound/Ulcer of the Lower Extremity Wound Location: Right Lower Leg Wound Status: Open Wounding Event: Skin Tear/Laceration Comorbid History: Type II Diabetes, Osteoarthritis Date Acquired: 11/19/2022 Weeks Of Treatment: 0 Clustered Wound: No Photos Wound Measurements Length: (cm) 2 Width: (cm) 2 Depth: (cm) 0.2 Area: (cm) 3.142 Volume: (cm) 0.628 % Reduction in Area: % Reduction in Volume: Epithelialization: None Tunneling: No Undermining: No Wound Description Classification: Grade 2 Wound Margin: Distinct, outline attached Exudate Amount: Medium Exudate Type: Serosanguineous Exudate Color: red, brown Foul Odor After Cleansing: No Slough/Fibrino Yes Wound Bed Granulation Amount: None Present (0%) Exposed Structure Necrotic Amount: Large (67-100%) Fascia Exposed: No Necrotic Quality: Eschar Fat Layer (Subcutaneous Tissue) Exposed: Yes Tendon Exposed: No Muscle Exposed: No Joint Exposed: No Bone Exposed: No Periwound Skin Texture Texture Color No Abnormalities Noted: No No Abnormalities Noted: No [name], [name] ([id number]) [id number] Page 8 of 8 Erythema: Yes Moisture No Abnormalities Noted: No Treatment Notes Wound #1 (Lower Leg) Wound Laterality: Right Cleanser Soap and Water Discharge Instruction: May shower and wash wound with dial antibacterial soap and water prior to dressing change. Peri-Wound Care Sween Lotion (Moisturizing lotion) Discharge Instruction: Apply moisturizing lotion as directed Topical Primary Dressing IODOFLEX 0.9% Cadexomer Iodine Pad 4x6 cm Discharge Instruction: Apply to wound bed as  instructed Secondary Dressing ABD Pad, 5x9 Discharge Instruction: Apply over primary dressing as directed. Woven Gauze Sponge, Non-Sterile 4x4 in Discharge Instruction: Apply over primary dressing as directed. Secured With Compression Wrap ThreePress (3 layer compression wrap) Discharge Instruction: Apply three layer compression as directed. Surgilast Tubular Elastic Stretch Net Dressing Size 4 Compression Stockings Add-Ons Electronic Signature(s) Signed: 11/19/2022 4:06:00 PM By: Sharyn Creamer RN, BSN Entered By: Sharyn Creamer on 11/19/2022 09:27:27 -------------------------------------------------------------------------------- Vitals Details Patient Name: Date of Service: [name], [name]. 11/19/2022 9:00 [name] Medical Record Number: [id number] Patient Account Number: [id number] Date of Birth/Sex: Treating RN: [birth year] (86 y.o. [name] Primary Care Lewie Deman: [name] Other Clinician: Referring Edra Riccardi: Treating Braden Deloach/Extender: [name] [name] Weeks in Treatment: 0 Vital Signs Time Taken: 09:06 Temperature (F): 98.1 Pulse (bpm): 93 Respiratory Rate (breaths/min): 18 Blood Pressure (mmHg): 191/77 Capillary Blood Glucose (mg/dl): 148 Reference Range: 80 - 120 mg / dl Electronic Signature(s) Signed: 11/19/2022 4:06:00 PM By: Sharyn Creamer RN, BSN Entered By: Sharyn Creamer on 11/19/2022 10:02:27

## 2022-11-20 NOTE — Progress Notes (Signed)
[name], [name] ([id number]) [phone].pdf Page 1 of 4 Visit Report for 11/19/2022 Abuse Risk Screen Details Patient Name: Date of Service: [name], Deborah Pane Gonzalez. 11/19/2022 9:00 Deborah M Medical Record Number: [id number] Patient Account Number: [id number] Date of Birth/Sex: Treating RN: 05-May-1936 (86 y.o. [name] Primary Care [name]: [name] Other Clinician: Referring Pavel Gadd: Treating [name]/Extender: [name] [name], KEA V IE Weeks in Treatment: 0 Abuse Risk Screen Items Answer ABUSE RISK SCREEN: Has anyone close to you tried to hurt or harm you recentlyo No Do you feel uncomfortable with anyone in your familyo No Has anyone forced you do things that you didnt want to doo No Electronic Signature(s) Signed: 11/19/2022 4:06:00 PM By: Sharyn Creamer RN, BSN Entered By: Sharyn Creamer on 11/19/2022 09:17:50 -------------------------------------------------------------------------------- Activities of Daily Living Details Patient Name: Date of Service: [name], Deborah Pane Gonzalez. 11/19/2022 9:00 Deborah M Medical Record Number: [id number] Patient Account Number: [id number] Date of Birth/Sex: Treating RN: Nov 09, 1936 (86 y.o. [name] Primary Care Ronda Kazmi: [name] Other Clinician: Referring Korra Christine: Treating Zykiria Bruening/Extender: [name] [name], KEA V IE Weeks in Treatment: 0 Activities of Daily Living Items Answer Activities of Daily Living (Please select one for each item) Drive Automobile Completely Able T Medications ake Completely Able Use T elephone Completely Able Care for Appearance Completely Able Use T oilet Completely Able Bath / Shower Completely Able Dress Self Completely Able Feed Self Completely Able Walk Completely Able Get In / Out Bed Completely Able Housework Completely Able Prepare Meals Completely Able Handle Money Completely Able Shop for Self Completely Able Electronic  Signature(s) Signed: 11/19/2022 4:06:00 PM By: Sharyn Creamer RN, BSN Entered By: Sharyn Creamer on 11/19/2022 09:18:11 [name] ([id number]) [id number] Page 2 of 4 -------------------------------------------------------------------------------- Education Screening Details Patient Name: Date of Service: [name], Deborah Pane Gonzalez. 11/19/2022 9:00 Deborah M Medical Record Number: [id number] Patient Account Number: [id number] Date of Birth/Sex: Treating RN: 1936-10-02 (86 y.o. [name] Primary Care Samiyyah Moffa: [name] Other Clinician: Referring Arnola Crittendon: Treating Aliese Brannum/Extender: [name] [name], Ricki Miller IE Weeks in Treatment: 0 Learning Preferences/Education Level/Primary Language Learning Preference: Explanation, Demonstration, Printed Material Highest Education Level: High School Preferred Language: English Cognitive Barrier Language Barrier: No Translator Needed: No Memory Deficit: No Emotional Barrier: No Cultural/Religious Beliefs Affecting Medical Care: No Physical Barrier Impaired Vision: No Impaired Hearing: No Decreased Hand dexterity: No Knowledge/Comprehension Knowledge Level: High Comprehension Level: High Ability to understand written instructions: High Ability to understand verbal instructions: High Motivation Anxiety Level: Calm Cooperation: Cooperative Education Importance: Acknowledges Need Interest in Health Problems: Asks Questions Perception: Coherent Willingness to Engage in Self-Management High Activities: Readiness to Engage in Self-Management High Activities: Electronic Signature(s) Signed: 11/19/2022 4:06:00 PM By: Sharyn Creamer RN, BSN Entered By: Sharyn Creamer on 11/19/2022 09:18:37 -------------------------------------------------------------------------------- Fall Risk Assessment Details Patient Name: Date of Service: [name], [name]. 11/19/2022 9:00 Deborah M Medical Record Number:  [id number] Patient Account Number: [id number] Date of Birth/Sex: Treating RN: October 18, 1936 (86 y.o. [name] Primary Care Trentan Trippe: [name] Other Clinician: Referring Shahil Speegle: Treating Seneca Hoback/Extender: [name] [name], KEA V IE Weeks in Treatment: 0 Fall Risk Assessment Items Have you had 2 or more falls in the last 12 monthso 0 No Have you had any fall that resulted in injury in the last 12 monthso 0 No [name], [name] ([id number]) [phone] [id number] Page 3 of 4 FALLS RISK SCREEN History of falling - immediate or  within 3 months 0 No Secondary diagnosis (Do you have 2 or more medical diagnoseso) 0 No Ambulatory aid None/bed rest/wheelchair/nurse 0 Yes Crutches/cane/walker 0 No Furniture 0 No Intravenous therapy Access/Saline/Heparin Lock 0 No Gait/Transferring Normal/ bed rest/ wheelchair 0 Yes Weak (short steps with or without shuffle, stooped but able to lift head while walking, may seek 0 No support from furniture) Impaired (short steps with shuffle, may have difficulty arising from chair, head down, impaired 0 No balance) Mental Status Oriented to own ability 0 Yes Electronic Signature(s) Signed: 11/19/2022 4:06:00 PM By: Sharyn Creamer RN, BSN Entered By: Sharyn Creamer on 11/19/2022 09:19:04 -------------------------------------------------------------------------------- Foot Assessment Details Patient Name: Date of Service: [name], [name]. 11/19/2022 9:00 Deborah M Medical Record Number: [id number] Patient Account Number: [id number] Date of Birth/Sex: Treating RN: May 02, 1936 (86 y.o. [name] Primary Care [name]: [name] Other Clinician: Referring Tyon Cerasoli: Treating [name]/Extender: [name] [name], KEA V IE Weeks in Treatment: 0 Foot Assessment Items Site Locations + = Sensation present, - = Sensation absent, C = Callus, U = Ulcer R = Redness, W = Warmth, M = Maceration, PU = Pre-ulcerative  lesion F = Fissure, S = Swelling, Gonzalez = Dryness Assessment Right: Left: Other Deformity: No No Prior Foot Ulcer: No No Prior Amputation: No No Charcot Joint: No No Ambulatory Status: GaitCASSANDRA, [name] ([id number]) [id number] Page 4 of 4 Electronic Signature(s) Signed: 11/19/2022 4:06:00 PM By: Sharyn Creamer RN, BSN Entered By: Sharyn Creamer on 11/19/2022 09:20:37 -------------------------------------------------------------------------------- Nutrition Risk Screening Details Patient Name: Date of Service: [name], [name]. 11/19/2022 9:00 Deborah M Medical Record Number: [id number] Patient Account Number: [id number] Date of Birth/Sex: Treating RN: 1936-11-26 (86 y.o. [name] Primary Care Seve Monette: [name] Other Clinician: Referring Rydan Gulyas: Treating Dhruvi Crenshaw/Extender: [name] [name], KEA V IE Weeks in Treatment: 0 Height (in): Weight (lbs): Body Mass Index (BMI): Nutrition Risk Screening Items Score Screening NUTRITION RISK SCREEN: I have an illness or condition that made me change the kind and/or amount of food I eat 0 No I eat fewer than two meals per day 0 No I eat few fruits and vegetables, or milk products 0 No I have three or more drinks of beer, liquor or wine almost every day 0 No I have tooth or mouth problems that make it hard for me to eat 0 No I don't always have enough money to buy the food I need 0 No I eat alone most of the time 0 No I take three or more different prescribed or over-the-counter drugs Deborah day 0 No Without wanting to, I have lost or gained 10 pounds in the last six months 0 No I am not always physically able to shop, cook and/or feed myself 0 No Nutrition Protocols Good Risk Protocol Moderate Risk Protocol High Risk Proctocol Risk Level: Good Risk Score: 0 Electronic Signature(s) Signed: 11/19/2022 4:06:00 PM By: Sharyn Creamer RN, BSN Entered By: Sharyn Creamer on 11/19/2022  09:19:18

## 2022-11-26 ENCOUNTER — Encounter (HOSPITAL_BASED_OUTPATIENT_CLINIC_OR_DEPARTMENT_OTHER): Payer: Medicare Other | Admitting: Internal Medicine

## 2022-11-26 DIAGNOSIS — L97312 Non-pressure chronic ulcer of right ankle with fat layer exposed: Secondary | ICD-10-CM | POA: Diagnosis not present

## 2022-11-26 DIAGNOSIS — Z833 Family history of diabetes mellitus: Secondary | ICD-10-CM | POA: Diagnosis not present

## 2022-11-26 DIAGNOSIS — E11622 Type 2 diabetes mellitus with other skin ulcer: Secondary | ICD-10-CM | POA: Diagnosis not present

## 2022-11-26 DIAGNOSIS — L97812 Non-pressure chronic ulcer of other part of right lower leg with fat layer exposed: Secondary | ICD-10-CM | POA: Diagnosis not present

## 2022-11-27 NOTE — Progress Notes (Signed)
[name], [name] ([id number]) [id number] Page 1 of 7 Visit Report for 11/26/2022 Arrival Information Details Patient Name: Date of Service: [name], [name] 11/26/2022 2:30 PM Medical Record Number: [id number] Patient Account Number: [id number] Date of Birth/Sex: Treating RN: 09-Jul-1936 (86 y.o. F) Primary Care Kaycie Pegues: Jerene Bears Other Clinician: Referring Dalasia Predmore: Treating Charizma Gardiner/Extender: Greta Doom Weeks in Treatment: 1 Visit Information History Since Last Visit All ordered tests and consults were completed: No Patient Arrived: Ambulatory Added or deleted any medications: No Arrival Time: 14:54 Any new allergies or adverse reactions: No Accompanied By: self Had Deborah fall or experienced change in No Transfer Assistance: None activities of daily living that may affect Patient Identification Verified: Yes risk of falls: Secondary Verification Process Completed: Yes Signs or symptoms of abuse/neglect since last visito No Hospitalized since last visit: No Implantable device outside of the clinic excluding No cellular tissue based products placed in the center since last visit: Pain Present Now: No Electronic Signature(s) Signed: 11/26/2022 3:40:29 PM By: Worthy Rancher Entered By: Worthy Rancher on 11/26/2022 14:55:04 -------------------------------------------------------------------------------- Encounter Discharge Information Details Patient Name: Date of Service: [name], Deborah DRIA N D. 11/26/2022 2:30 PM Medical Record Number: [id number] Patient Account Number: [id number] Date of Birth/Sex: Treating RN: 1936-03-31 (86 y.o. [name] Primary Care Issabela Lesko: Jerene Bears Other Clinician: Referring Talisa Petrak: Treating Krimson Massmann/Extender: Greta Doom Weeks in Treatment: 1 Encounter Discharge Information Items Post Procedure Vitals Discharge Condition: Stable Temperature (F): 98.1 Ambulatory Status:  Ambulatory Pulse (bpm): 71 Discharge Destination: Home Respiratory Rate (breaths/min): 20 Transportation: Private Auto Blood Pressure (mmHg): 185/99 Accompanied By: self Schedule Follow-up Appointment: Yes Clinical Summary of Care: Patient Declined Electronic Signature(s) Signed: 11/26/2022 4:22:59 PM By: Adline Peals Entered By: Adline Peals on 11/26/2022 15:37:24 [name] ([id number]) [id number] Page 2 of 7 -------------------------------------------------------------------------------- Lower Extremity Assessment Details Patient Name: Date of Service: [name], Deborah Pane D. 11/26/2022 2:30 PM Medical Record Number: [id number] Patient Account Number: [id number] Date of Birth/Sex: Treating RN: 12-04-1936 (86 y.o. F) Primary Care Jream Broyles: Jerene Bears Other Clinician: Referring Kanyla Omeara: Treating Cabella Kimm/Extender: Ernestina Patches, Dhruv Weeks in Treatment: 1 Edema Assessment Assessed: [Left: No] [Right: No] [Left: Edema] [Right: :] Calf Left: Right: Point of Measurement: 28 cm From Medial Instep 33.5 cm Ankle Left: Right: Point of Measurement: 11 cm From Medial Instep 20.5 cm Electronic Signature(s) Signed: 11/26/2022 4:26:33 PM By: Erenest Blank Entered By: Erenest Blank on 11/26/2022 15:07:33 -------------------------------------------------------------------------------- Multi Wound Chart Details Patient Name: Date of Service: [name], Deborah DRIA N D. 11/26/2022 2:30 PM Medical Record Number: [id number] Patient Account Number: [id number] Date of Birth/Sex: Treating RN: 12/12/1936 (86 y.o. F) Primary Care [name]: Jerene Bears Other Clinician: Referring Nuha Degner: Treating Brandonlee Navis/Extender: Ernestina Patches, Dhruv Weeks in Treatment: 1 Vital Signs Height(in): 62 Capillary Blood Glucose(mg/dl): 144 Weight(lbs): 161 Pulse(bpm): 71 Body Mass Index(BMI): 29.4 Blood Pressure(mmHg): 185/99 Temperature(F):  98.1 Respiratory Rate(breaths/min): 20 [1:Photos:] [N/Deborah:N/Deborah] Right Lower Leg N/Deborah N/Deborah Wound Location: Skin Tear/Laceration N/Deborah N/Deborah Wounding Event: Diabetic Wound/Ulcer of the Lower N/Deborah N/Deborah Primary Etiology: Extremity Type II Diabetes, Osteoarthritis N/Deborah N/Deborah Comorbid History: 11/19/2022 N/Deborah N/Deborah Date Acquired: NICLE, CONNOLE ([id number]) [id number] Page 3 of 7 1 N/Deborah N/Deborah Weeks of Treatment: Open N/Deborah N/Deborah Wound Status: No N/Deborah N/Deborah Wound Recurrence: 1.8x1.5x0.2 N/Deborah N/Deborah Measurements L x W x D (cm) 2.121 N/Deborah N/Deborah Deborah (cm) : rea 0.424 N/Deborah N/Deborah Volume (cm) : 32.50% N/Deborah N/Deborah % Reduction in Deborah rea: 32.50% N/Deborah N/Deborah %  Reduction in Volume: Grade 2 N/Deborah N/Deborah Classification: Medium N/Deborah N/Deborah Exudate Deborah mount: Serosanguineous N/Deborah N/Deborah Exudate Type: red, brown N/Deborah N/Deborah Exudate Color: Distinct, outline attached N/Deborah N/Deborah Wound Margin: Medium (34-66%) N/Deborah N/Deborah Granulation Deborah mount: Medium (34-66%) N/Deborah N/Deborah Necrotic Deborah mount: Eschar N/Deborah N/Deborah Necrotic Tissue: Fat Layer (Subcutaneous Tissue): Yes N/Deborah N/Deborah Exposed Structures: Fascia: No Tendon: No Muscle: No Joint: No Bone: No None N/Deborah N/Deborah Epithelialization: Debridement - Selective/Open Wound N/Deborah N/Deborah Debridement: Pre-procedure Verification/Time Out 15:34 N/Deborah N/Deborah Taken: Lidocaine 4% Topical Solution N/Deborah N/Deborah Pain Control: Necrotic/Eschar, Slough N/Deborah N/Deborah Tissue Debrided: Non-Viable Tissue N/Deborah N/Deborah Level: 2.7 N/Deborah N/Deborah Debridement Deborah (sq cm): rea Curette N/Deborah N/Deborah Instrument: Minimum N/Deborah N/Deborah Bleeding: Pressure N/Deborah N/Deborah Hemostasis Deborah chieved: Procedure was tolerated well N/Deborah N/Deborah Debridement Treatment Response: 1.8x1.5x0.2 N/Deborah N/Deborah Post Debridement Measurements L x W x D (cm) 0.424 N/Deborah N/Deborah Post Debridement Volume: (cm) Erythema: Yes N/Deborah N/Deborah Periwound Skin Color: Debridement N/Deborah N/Deborah Procedures Performed: Treatment Notes Wound #1 (Lower Leg) Wound Laterality: Right Cleanser Soap and Water Discharge Instruction:  May shower and wash wound with dial antibacterial soap and water prior to dressing change. Peri-Wound Care Sween Lotion (Moisturizing lotion) Discharge Instruction: Apply moisturizing lotion as directed Topical Primary Dressing IODOFLEX 0.9% Cadexomer Iodine Pad 4x6 cm Discharge Instruction: Apply to wound bed as instructed Secondary Dressing ABD Pad, 5x9 Discharge Instruction: Apply over primary dressing as directed. Woven Gauze Sponge, Non-Sterile 4x4 in Discharge Instruction: Apply over primary dressing as directed. Secured With Compression Wrap ThreePress (3 layer compression wrap) Discharge Instruction: Apply three layer compression as directed. Surgilast Tubular Elastic Stretch Net Dressing Size 4 Compression Stockings Add-Ons Electronic Signature(s) Signed: 11/26/2022 5:47:10 PM By: Linton Ham MD Entered By: Linton Ham on 11/26/2022 17:10:19 [name] ([id number]) [id number] Page 4 of 7 -------------------------------------------------------------------------------- Multi-Disciplinary Care Plan Details Patient Name: Date of Service: [name], [name] 11/26/2022 2:30 PM Medical Record Number: [id number] Patient Account Number: [id number] Date of Birth/Sex: Treating RN: [birth year] (86 y.o. [name] Primary Care Ladonya Jerkins: Jerene Bears Other Clinician: Referring Jamesa Tedrick: Treating Simara Rhyner/Extender: Greta Doom Weeks in Treatment: 1 Active Inactive Wound/Skin Impairment Nursing Diagnoses: Impaired tissue integrity Knowledge deficit related to ulceration/compromised skin integrity Goals: Patient/caregiver will verbalize understanding of skin care regimen Date Initiated: 11/19/2022 Target Resolution Date: 12/24/2022 Goal Status: Active Ulcer/skin breakdown will have Deborah volume reduction of 30% by week 4 Date Initiated: 11/19/2022 Target Resolution Date: 12/31/2022 Goal Status:  Active Interventions: Assess patient/caregiver ability to obtain necessary supplies Assess patient/caregiver ability to perform ulcer/skin care regimen upon admission and as needed Assess ulceration(s) every visit Provide education on ulcer and skin care Notes: Electronic Signature(s) Signed: 11/26/2022 4:22:59 PM By: Adline Peals Entered By: Adline Peals on 11/26/2022 15:36:00 -------------------------------------------------------------------------------- Pain Assessment Details Patient Name: Date of Service: [name], Deborah Pane D. 11/26/2022 2:30 PM Medical Record Number: [id number] Patient Account Number: [id number] Date of Birth/Sex: Treating RN: [birth year] (86 y.o. F) Primary Care Leiland Mihelich: Jerene Bears Other Clinician: Referring Donielle Radziewicz: Treating Alecxis Baltzell/Extender: Greta Doom Weeks in Treatment: 1 Active Problems Location of Pain Severity and Description of Pain Patient Has Paino Yes Site Locations Rate the pain. ARLENIS, BLAYDES ([id number]) [id number] Page 5 of 7 Rate the pain. Current Pain Level: Insensate Worst Pain Level: 10 Least Pain Level: 0 Tolerable Pain Level: 2 Character of Pain Describe the Pain: Sharp, Shooting Pain Management and Medication Current Pain Management: Electronic Signature(s) Signed: 11/26/2022 3:40:29 PM By: Harrington Challenger,  Aisha Entered ByWorthy Rancher on 11/26/2022 14:56:59 -------------------------------------------------------------------------------- Patient/Caregiver Education Details Patient Name: Date of Service: [name], [name]. 12/13/2023andnbsp2:30 PM Medical Record Number: [id number] Patient Account Number: [id number] Date of Birth/Gender: Treating RN: 12/17/1935 (86 y.o. [name] Primary Care Physician: Jerene Bears Other Clinician: Referring Physician: Treating Physician/Extender: Samul Dada in Treatment: 1 Education Assessment Education  Provided To: Patient Education Topics Provided Wound/Skin Impairment: Methods: Explain/Verbal Responses: Reinforcements needed, State content correctly Electronic Signature(s) Signed: 11/26/2022 4:22:59 PM By: Adline Peals Entered By: Adline Peals on 11/26/2022 15:36:14 -------------------------------------------------------------------------------- Wound Assessment Details Patient Name: Date of Service: [name], Deborah DRIA N D. 11/26/2022 2:30 PM Medical Record Number: [id number] Patient Account Number: [id number] SAMANTHAMARIE, EZZELL ([id number]) [id number] Page 6 of 7 Date of Birth/Sex: Treating RN: Sep 30, 1936 (86 y.o. F) Primary Care Chioke Noxon: Other Clinician: Jerene Bears Referring Daliana Leverett: Treating Kameryn Tisdel/Extender: Ernestina Patches, Dhruv Weeks in Treatment: 1 Wound Status Wound Number: 1 Primary Etiology: Diabetic Wound/Ulcer of the Lower Extremity Wound Location: Right Lower Leg Wound Status: Open Wounding Event: Skin Tear/Laceration Comorbid History: Type II Diabetes, Osteoarthritis Date Acquired: 11/19/2022 Weeks Of Treatment: 1 Clustered Wound: No Photos Wound Measurements Length: (cm) 1.8 Width: (cm) 1.5 Depth: (cm) 0.2 Area: (cm) 2.121 Volume: (cm) 0.424 % Reduction in Area: 32.5% % Reduction in Volume: 32.5% Epithelialization: None Tunneling: No Undermining: No Wound Description Classification: Grade 2 Wound Margin: Distinct, outline attached Exudate Amount: Medium Exudate Type: Serosanguineous Exudate Color: red, brown Foul Odor After Cleansing: No Slough/Fibrino Yes Wound Bed Granulation Amount: Medium (34-66%) Exposed Structure Necrotic Amount: Medium (34-66%) Fascia Exposed: No Necrotic Quality: Eschar Fat Layer (Subcutaneous Tissue) Exposed: Yes Tendon Exposed: No Muscle Exposed: No Joint Exposed: No Bone Exposed: No Periwound Skin Texture Texture Color No Abnormalities Noted: No No Abnormalities  Noted: No Erythema: Yes Moisture No Abnormalities Noted: No Treatment Notes Wound #1 (Lower Leg) Wound Laterality: Right Cleanser Soap and Water Discharge Instruction: May shower and wash wound with dial antibacterial soap and water prior to dressing change. Peri-Wound Care Sween Lotion (Moisturizing lotion) Discharge Instruction: Apply moisturizing lotion as directed Topical Primary Dressing IODOFLEX 0.9% Cadexomer Iodine Pad 4x6 cm Discharge Instruction: Apply to wound bed as instructed MAKALYA, NAVE ([id number]) [id number] Page 7 of 7 Secondary Dressing ABD Pad, 5x9 Discharge Instruction: Apply over primary dressing as directed. Woven Gauze Sponge, Non-Sterile 4x4 in Discharge Instruction: Apply over primary dressing as directed. Secured With Compression Wrap ThreePress (3 layer compression wrap) Discharge Instruction: Apply three layer compression as directed. Surgilast Tubular Elastic Stretch Net Dressing Size 4 Compression Stockings Add-Ons Electronic Signature(s) Signed: 11/26/2022 4:26:33 PM By: Erenest Blank Entered By: Erenest Blank on 11/26/2022 15:07:49 -------------------------------------------------------------------------------- Vitals Details Patient Name: Date of Service: [name], Deborah DRIA N D. 11/26/2022 2:30 PM Medical Record Number: [id number] Patient Account Number: [id number] Date of Birth/Sex: Treating RN: 25-Jun-1936 (86 y.o. F) Primary Care Jaleel Allen: Jerene Bears Other Clinician: Referring Erika Slaby: Treating Rease Wence/Extender: Ernestina Patches, Dhruv Weeks in Treatment: 1 Vital Signs Time Taken: 02:55 Temperature (F): 98.1 Height (in): 62 Pulse (bpm): 71 Weight (lbs): 161 Respiratory Rate (breaths/min): 20 Body Mass Index (BMI): 29.4 Blood Pressure (mmHg): 185/99 Capillary Blood Glucose (mg/dl): 144 Reference Range: 80 - 120 mg / dl Electronic Signature(s) Signed: 11/26/2022 3:40:29 PM By: Worthy Rancher Entered By: Worthy Rancher on 11/26/2022 14:55:49

## 2022-11-27 NOTE — Progress Notes (Signed)
Deborah, Gonzalez ([id number]) [id number] Page 1 of 6 Visit Report for 11/26/2022 Debridement Details Patient Name: Date of Service: Deborah RTSilvestre Gonzalez 11/26/2022 2:30 PM Medical Record Number: [id number] Patient Account Number: [id number] Date of Birth/Sex: Treating Gonzalez: Mar 30, 1936 (86 y.o. F) Primary Care Provider: Jerene Gonzalez Other Clinician: Referring Provider: Treating Provider/Extender: [name] Weeks in Treatment: 1 Debridement Performed for Assessment: Wound #1 Right Lower Leg Performed By: Physician [name]., MD Debridement Type: Debridement Severity of Tissue Pre Debridement: Fat layer exposed Level of Consciousness (Pre-procedure): Awake and Alert Pre-procedure Verification/Time Out Yes - 15:34 Taken: Start Time: 15:34 Pain Control: Lidocaine 4% T opical Solution T Area Debrided (L x W): otal 1.8 (cm) x 1.5 (cm) = 2.7 (cm) Tissue and other material debrided: Non-Viable, Eschar, Slough, Slough Level: Non-Viable Tissue Debridement Description: Selective/Open Wound Instrument: Curette Bleeding: Minimum Hemostasis Achieved: Pressure Response to Treatment: Procedure was tolerated well Level of Consciousness (Post- Awake and Alert procedure): Post Debridement Measurements of Total Wound Length: (cm) 1.8 Width: (cm) 1.5 Depth: (cm) 0.2 Volume: (cm) 0.424 Character of Wound/Ulcer Post Debridement: Improved Severity of Tissue Post Debridement: Fat layer exposed Post Procedure Diagnosis Same as Pre-procedure Notes scribed for Dr. Dellia Gonzalez by [name] Electronic Signature(s) Signed: 11/26/2022 5:47:10 PM By: Deborah Ham MD Previous Signature: 11/26/2022 4:22:59 PM Version By: [name] Entered By: [name] on 11/26/2022 17:10:33 -------------------------------------------------------------------------------- HPI Details Patient Name: Date of Service: [name], Deborah DRIA N D.  11/26/2022 2:30 PM Medical Record Number: [id number] Patient Account Number: [id number] Date of Birth/Sex: Treating Gonzalez: [birth year] (86 y.o. F) Primary Care Provider: Jerene Gonzalez Other Clinician: Referring Provider: Treating Provider/Extender: [name] Weeks in Treatment: 1 History of Present Illness [name], [name] ([id number]) [id number] Page 2 of 6 HPI Description: ADMISSION 11/19/2022 This is an 86 year old woman who was apparently diagnosed with cellulitis in October and placed on doxycycline. She was on doxycycline for over Deborah month. She developed an ulcer on her right ankle. It apparently expanded fairly dramatically over the course of about Deborah week and as Deborah result, she sought treatment at Deborah local emergency room. She was seen on November 16, 2022. At that point, the doxycycline was discontinued and clindamycin prescribed. She was referred to the wound care center for further evaluation and management. She says that since starting the clindamycin, the erythema in her leg has resolved almost completely. She has Deborah triangular wound on her right lower leg, just medial to the anterior tibial surface. There is Deborah thick layer of eschar on the surface. There is no malodor or purulent drainage. Periwound skin is intact. 12/13; this is Deborah patient with Deborah wound on the right lower extremity. We have been using Iodoflex under 3 layer compression. She is completing her clindamycin Electronic Signature(s) Signed: 11/26/2022 5:47:10 PM By: Deborah Ham MD Entered By: [name] on 11/26/2022 17:12:05 -------------------------------------------------------------------------------- Physical Exam Details Patient Name: Date of Service: [name], Deborah DRIA N D. 11/26/2022 2:30 PM Medical Record Number: [id number] Patient Account Number: [id number] Date of Birth/Sex: Treating Gonzalez: 09-25-1936 (86 y.o. F) Primary Care Provider: Jerene Gonzalez Other Clinician: Referring  Provider: Treating Provider/Extender: [name], [name] Weeks in Treatment: 1 Constitutional Patient is hypotensive.. Pulse regular and within target range for patient.Marland Kitchen Respirations regular, non-labored and within target range.. Temperature is normal and within the target range for the patient.Marland Kitchen Appears in no distress. Notes Wound exam; she has Deborah small wound on the right lower leg  in the anterior tibial surface. Unfortunately under illumination eschar around the wound surface thick raised edges and the nonviable debris on the surface all requiring debridement with Deborah #3 curette I was able to get this to Deborah better looking wound surface with healthier edges. Hemostasis with direct pressure Electronic Signature(s) Signed: 11/26/2022 5:47:10 PM By: Deborah Ham MD Entered By: [name] on 11/26/2022 17:14:55 -------------------------------------------------------------------------------- Physician Orders Details Patient Name: Date of Service: [name], Deborah DRIA N D. 11/26/2022 2:30 PM Medical Record Number: [id number] Patient Account Number: [id number] Date of Birth/Sex: Treating Gonzalez: July 14, 1936 (86 y.o. [name] Primary Care Provider: Jerene Gonzalez Other Clinician: Referring Provider: Treating Provider/Extender: [name] Weeks in Treatment: 1 Verbal / Phone Orders: No Diagnosis Coding Follow-up Appointments ppointment in 1 week. - Dr. Celine Gonzalez - Room 1 Return Deborah Anesthetic Wound #1 Right Lower Leg (In clinic) Topical Lidocaine 4% applied to wound bed [name], [name] ([id number]) [id number] Page 3 of 6 Bathing/ Shower/ Hygiene May shower with protection but do not get wound dressing(s) wet. - may purchase cast protector from [name], [name], or [name] Edema Control - Lymphedema / SCD / Other Right Lower Extremity Elevate legs to the level of the heart or above for 30 minutes daily and/or when sitting, Deborah frequency  of: Avoid standing for long periods of time. Wound Treatment Wound #1 - Lower Leg Wound Laterality: Right Cleanser: Soap and Water Discharge Instructions: May shower and wash wound with dial antibacterial soap and water prior to dressing change. Peri-Wound Care: Sween Lotion (Moisturizing lotion) Discharge Instructions: Apply moisturizing lotion as directed Prim Dressing: IODOFLEX 0.9% Cadexomer Iodine Pad 4x6 cm ary Discharge Instructions: Apply to wound bed as instructed Secondary Dressing: ABD Pad, 5x9 Discharge Instructions: Apply over primary dressing as directed. Secondary Dressing: Woven Gauze Sponge, Non-Sterile 4x4 in Discharge Instructions: Apply over primary dressing as directed. Compression Wrap: Kerlix Roll 4.5x3.1 (in/yd) Discharge Instructions: Apply Kerlix and Coban compression as directed. Compression Wrap: Coban Self-Adherent Wrap 4x5 (in/yd) Discharge Instructions: Apply over Kerlix as directed. Compression Wrap: Surgilast Tubular Elastic Stretch Net Dressing Size 4 Patient Medications llergies: penicillin, acetaminophen, ciprofloxacin, naproxen, Septra, Motrin Deborah Notifications Medication Indication Start End 11/26/2022 lidocaine DOSE topical 4 % cream - cream topical Electronic Signature(s) Signed: 11/26/2022 4:22:59 PM By: [name] Signed: 11/26/2022 5:47:10 PM By: Deborah Ham MD Entered By: [name] on 11/26/2022 15:38:25 -------------------------------------------------------------------------------- Problem List Details Patient Name: Date of Service: [name], Deborah DRIA N D. 11/26/2022 2:30 PM Medical Record Number: [id number] Patient Account Number: [id number] Date of Birth/Sex: Treating Gonzalez: 1936/08/27 (86 y.o. F) Primary Care Provider: Jerene Gonzalez Other Clinician: Referring Provider: Treating Provider/Extender: [name], [name] Weeks in Treatment: 1 Active Problems ICD-10 Encounter Code Description Active Date  MDM Diagnosis L97.312 Non-pressure chronic ulcer of right ankle with fat layer exposed 11/19/2022 No Yes [name], [name] ([id number]) [id number] Page 4 of 6 Inactive Problems Resolved Problems Electronic Signature(s) Signed: 11/26/2022 5:47:10 PM By: Deborah Ham MD Entered By: [name] on 11/26/2022 17:10:12 -------------------------------------------------------------------------------- Progress Note Details Patient Name: Date of Service: [name], Deborah DRIA N D. 11/26/2022 2:30 PM Medical Record Number: [id number] Patient Account Number: [id number] Date of Birth/Sex: Treating Gonzalez: 09/06/1936 (86 y.o. F) Primary Care Provider: Jerene Gonzalez Other Clinician: Referring Provider: Treating Provider/Extender: [name], [name] Weeks in Treatment: 1 Subjective History of Present Illness (HPI) ADMISSION 11/19/2022 This is an 86 year old woman who was apparently diagnosed with cellulitis in October and placed on doxycycline. She was on  doxycycline for over Deborah month. She developed an ulcer on her right ankle. It apparently expanded fairly dramatically over the course of about Deborah week and as Deborah result, she sought treatment at Deborah local emergency room. She was seen on November 16, 2022. At that point, the doxycycline was discontinued and clindamycin prescribed. She was referred to the wound care center for further evaluation and management. She says that since starting the clindamycin, the erythema in her leg has resolved almost completely. She has Deborah triangular wound on her right lower leg, just medial to the anterior tibial surface. There is Deborah thick layer of eschar on the surface. There is no malodor or purulent drainage. Periwound skin is intact. 12/13; this is Deborah patient with Deborah wound on the right lower extremity. We have been using Iodoflex under 3 layer compression. She is completing her clindamycin Objective Constitutional Patient is hypotensive.. Pulse  regular and within target range for patient.Marland Kitchen Respirations regular, non-labored and within target range.. Temperature is normal and within the target range for the patient.Marland Kitchen Appears in no distress. Vitals Time Taken: 2:55 AM, Height: 62 in, Weight: 161 lbs, BMI: 29.4, Temperature: 98.1 F, Pulse: 71 bpm, Respiratory Rate: 20 breaths/min, Blood Pressure: 185/99 mmHg, Capillary Blood Glucose: 144 mg/dl. General Notes: Wound exam; she has Deborah small wound on the right lower leg in the anterior tibial surface. Unfortunately under illumination eschar around the wound surface thick raised edges and the nonviable debris on the surface all requiring debridement with Deborah #3 curette I was able to get this to Deborah better looking wound surface with healthier edges. Hemostasis with direct pressure Integumentary (Hair, Skin) Wound #1 status is Open. Original cause of wound was Skin T ear/Laceration. The date acquired was: 11/19/2022. The wound has been in treatment 1 weeks. The wound is located on the Right Lower Leg. The wound measures 1.8cm length x 1.5cm width x 0.2cm depth; 2.121cm^2 area and 0.424cm^3 volume. There is Fat Layer (Subcutaneous Tissue) exposed. There is no tunneling or undermining noted. There is Deborah medium amount of serosanguineous drainage noted. The wound margin is distinct with the outline attached to the wound base. There is medium (34-66%) granulation within the wound bed. There is Deborah medium (34-66%) amount of necrotic tissue within the wound bed including Eschar. The periwound skin appearance exhibited: Erythema. The surrounding wound skin color is noted with erythema. Assessment Active Problems ICD-10 Non-pressure chronic ulcer of right ankle with fat layer exposed [name], [name] ([id number]) [id number] Page 5 of 6 Procedures Wound #1 Pre-procedure diagnosis of Wound #1 is Deborah Diabetic Wound/Ulcer of the Lower Extremity located on the Right Lower Leg .Severity of  Tissue Pre Debridement is: Fat layer exposed. There was Deborah Selective/Open Wound Non-Viable Tissue Debridement with Deborah total area of 2.7 sq cm performed by [name]., MD. With the following instrument(s): Curette to remove Non-Viable tissue/material. Material removed includes Eschar and Slough and after achieving pain control using Lidocaine 4% T opical Solution. No specimens were taken. Deborah time out was conducted at 15:34, prior to the start of the procedure. Deborah Minimum amount of bleeding was controlled with Pressure. The procedure was tolerated well. Post Debridement Measurements: 1.8cm length x 1.5cm width x 0.2cm depth; 0.424cm^3 volume. Character of Wound/Ulcer Post Debridement is improved. Severity of Tissue Post Debridement is: Fat layer exposed. Post procedure Diagnosis Wound #1: Same as Pre-Procedure General Notes: scribed for Dr. Dellia Gonzalez by [name]. Plan Follow-up Appointments: Return Appointment in 1 week. - Dr.  Celine Gonzalez - Room 1 Anesthetic: Wound #1 Right Lower Leg: (In clinic) Topical Lidocaine 4% applied to wound bed Bathing/ Shower/ Hygiene: May shower with protection but do not get wound dressing(s) wet. - may purchase cast protector from [name], [name], or [name] Edema Control - Lymphedema / SCD / Other: Elevate legs to the level of the heart or above for 30 minutes daily and/or when sitting, Deborah frequency of: Avoid standing for long periods of time. The following medication(s) was prescribed: lidocaine topical 4 % cream cream topical was prescribed at facility WOUND #1: - Lower Leg Wound Laterality: Right Cleanser: Soap and Water Discharge Instructions: May shower and wash wound with dial antibacterial soap and water prior to dressing change. Peri-Wound Care: Sween Lotion (Moisturizing lotion) Discharge Instructions: Apply moisturizing lotion as directed Prim Dressing: IODOFLEX 0.9% Cadexomer Iodine Pad 4x6 cm ary Discharge Instructions: Apply to wound bed as  instructed Secondary Dressing: ABD Pad, 5x9 Discharge Instructions: Apply over primary dressing as directed. Secondary Dressing: Woven Gauze Sponge, Non-Sterile 4x4 in Discharge Instructions: Apply over primary dressing as directed. Com pression Wrap: Kerlix Roll 4.5x3.1 (in/yd) Discharge Instructions: Apply Kerlix and Coban compression as directed. Com pression Wrap: Coban Self-Adherent Wrap 4x5 (in/yd) Discharge Instructions: Apply over Kerlix as directed. Com pression Wrap: Surgilast Tubular Elastic Stretch Net Dressing Size 4 1. Iodoflex under 3 layer compression to continue. She is likely to require continued mechanical debridements the surface of this is nonviable 2. She tells me that this was initially secondary to cellulitis. She may have some degree of chronic venous insufficiency but it certainly not Deborah major factor. Electronic Signature(s) Signed: 11/26/2022 5:47:10 PM By: Deborah Ham MD Entered By: [name] on 11/26/2022 17:16:18 -------------------------------------------------------------------------------- SuperBill Details Patient Name: Date of Service: [name], Deborah DRIA Delane Ginger D. 11/26/2022 Medical Record Number: [id number] Patient Account Number: [id number] Date of Birth/Sex: Treating Gonzalez: [birth year] (86 y.o. F) Primary Care Provider: Jerene Gonzalez Other Clinician: Referring Provider: Treating Provider/Extender: [name], [name] Weeks in Treatment: 1 Diagnosis Coding [name], [name] ([id number]) [id number] Page 6 of 6 ICD-10 Codes Code Description [phone] Non-pressure chronic ulcer of right ankle with fat layer exposed Facility Procedures : 7 CPT4 Code: [id number] Description: 02774 - DEBRIDE WOUND 1ST 20 SQ CM OR < ICD-10 Diagnosis Description J28.786 Non-pressure chronic ulcer of right ankle with fat layer exposed Modifier: Quantity: 1 Physician Procedures : CPT4 Code Description Modifier [id number] - WC PHYS DEBR WO  ANESTH 20 SQ CM ICD-10 Diagnosis Description E36.629 Non-pressure chronic ulcer of right ankle with fat layer exposed Quantity: 1 Electronic Signature(s) Signed: 11/26/2022 5:47:10 PM By: Deborah Ham MD Entered By: [name] on 11/26/2022 17:16:24

## 2022-12-01 ENCOUNTER — Encounter (HOSPITAL_BASED_OUTPATIENT_CLINIC_OR_DEPARTMENT_OTHER): Payer: Medicare Other | Admitting: Internal Medicine

## 2022-12-01 DIAGNOSIS — L97312 Non-pressure chronic ulcer of right ankle with fat layer exposed: Secondary | ICD-10-CM | POA: Diagnosis not present

## 2022-12-01 DIAGNOSIS — Z833 Family history of diabetes mellitus: Secondary | ICD-10-CM | POA: Diagnosis not present

## 2022-12-01 DIAGNOSIS — E11622 Type 2 diabetes mellitus with other skin ulcer: Secondary | ICD-10-CM | POA: Diagnosis not present

## 2022-12-02 NOTE — Progress Notes (Signed)
MASSIAH, LONGANECKER ([id number]) [id number] Page 1 of 5 Visit Report for 12/01/2022 Arrival Information Details Patient Name: Date of Service: [name] 12/01/2022 12:30 PM Medical Record Number: [id number] Patient Account Number: [id number] Date of Birth/Sex: Treating RN: 09/06/1936 (86 y.o. [name] Primary Care Shammond Arave: Jerene Bears Other Clinician: Referring Hawthorne Day: Treating Barre Aydelott/Extender: Greta Doom Weeks in Treatment: 1 Visit Information History Since Last Visit Added or deleted any medications: No Patient Arrived: Ambulatory Any new allergies or adverse reactions: No Arrival Time: 12:30 Had Deborah fall or experienced change in No Accompanied By: self activities of daily living that may affect Transfer Assistance: None risk of falls: Patient Identification Verified: Yes Signs or symptoms of abuse/neglect since last visito No Secondary Verification Process Completed: Yes Hospitalized since last visit: No Implantable device outside of the clinic excluding No cellular tissue based products placed in the center since last visit: Has Dressing in Place as Prescribed: Yes Has Compression in Place as Prescribed: Yes Pain Present Now: No Electronic Signature(s) Signed: 12/01/2022 5:14:26 PM By: Adline Peals Entered By: Adline Peals on 12/01/2022 12:31:06 -------------------------------------------------------------------------------- Clinic Level of Care Assessment Details Patient Name: Date of Service: [name], [name]. 12/01/2022 12:30 PM Medical Record Number: [id number] Patient Account Number: [id number] Date of Birth/Sex: Treating RN: [birth year] (86 y.o. [name] Primary Care Kodie Kishi: Jerene Bears Other Clinician: Referring Berta Denson: Treating Jayce Kainz/Extender: Greta Doom Weeks in Treatment: 1 Clinic Level of Care Assessment Items TOOL 4 Quantity Score X- 1  0 Use when only an EandM is performed on FOLLOW-UP visit ASSESSMENTS - Nursing Assessment / Reassessment '[]'$  - 0 Reassessment of Co-morbidities (includes updates in patient status) '[]'$  - 0 Reassessment of Adherence to Treatment Plan ASSESSMENTS - Wound and Skin Deborah ssessment / Reassessment '[]'$  - 0 Simple Wound Assessment / Reassessment - one wound '[]'$  - 0 Complex Wound Assessment / Reassessment - multiple wounds '[]'$  - 0 Dermatologic / Skin Assessment (not related to wound area) ASSESSMENTS - Focused Assessment '[]'$  - 0 Circumferential Edema Measurements - multi extremities '[]'$  - 0 Nutritional Assessment / Counseling / Intervention ESMA, KILTS ([id number]) [id number] Page 2 of 5 '[]'$  - 0 Lower Extremity Assessment (monofilament, tuning fork, pulses) '[]'$  - 0 Peripheral Arterial Disease Assessment (using hand held doppler) ASSESSMENTS - Ostomy and/or Continence Assessment and Care '[]'$  - 0 Incontinence Assessment and Management '[]'$  - 0 Ostomy Care Assessment and Management (repouching, etc.) PROCESS - Coordination of Care X - Simple Patient / Family Education for ongoing care 1 15 '[]'$  - 0 Complex (extensive) Patient / Family Education for ongoing care X- 1 10 Staff obtains Programmer, systems, Records, T Results / Process Orders est '[]'$  - 0 Staff telephones HHA, Nursing Homes / Clarify orders / etc '[]'$  - 0 Routine Transfer to another Facility (non-emergent condition) '[]'$  - 0 Routine Hospital Admission (non-emergent condition) '[]'$  - 0 New Admissions / Biomedical engineer / Ordering NPWT Apligraf, etc. , '[]'$  - 0 Emergency Hospital Admission (emergent condition) X- 1 10 Simple Discharge Coordination '[]'$  - 0 Complex (extensive) Discharge Coordination PROCESS - Special Needs '[]'$  - 0 Pediatric / Minor Patient Management '[]'$  - 0 Isolation Patient Management '[]'$  - 0 Hearing / Language / Visual special needs '[]'$  - 0 Assessment of Community assistance (transportation, Gonzalez/C  planning, etc.) '[]'$  - 0 Additional assistance / Altered mentation '[]'$  - 0 Support Surface(s) Assessment (bed, cushion, seat, etc.) INTERVENTIONS - Wound Cleansing / Measurement X - Simple Wound Cleansing -  one wound 1 5 '[]'$  - 0 Complex Wound Cleansing - multiple wounds '[]'$  - 0 Wound Imaging (photographs - any number of wounds) '[]'$  - 0 Wound Tracing (instead of photographs) '[]'$  - 0 Simple Wound Measurement - one wound '[]'$  - 0 Complex Wound Measurement - multiple wounds INTERVENTIONS - Wound Dressings '[]'$  - 0 Small Wound Dressing one or multiple wounds X- 1 15 Medium Wound Dressing one or multiple wounds '[]'$  - 0 Large Wound Dressing one or multiple wounds '[]'$  - 0 Application of Medications - topical '[]'$  - 0 Application of Medications - injection INTERVENTIONS - Miscellaneous '[]'$  - 0 External ear exam '[]'$  - 0 Specimen Collection (cultures, biopsies, blood, body fluids, etc.) '[]'$  - 0 Specimen(s) / Culture(s) sent or taken to Lab for analysis '[]'$  - 0 Patient Transfer (multiple staff / Civil Service fast streamer / Similar devices) '[]'$  - 0 Simple Staple / Suture removal (25 or less) '[]'$  - 0 Complex Staple / Suture removal (26 or more) '[]'$  - 0 Hypo / Hyperglycemic Management (close monitor of Blood Glucose) [name] ([id number]) [id number] Page 3 of 5 '[]'$  - 0 Ankle / Brachial Index (ABI) - do not check if billed separately X- 1 5 Vital Signs Has the patient been seen at the hospital within the last three years: Yes Total Score: 60 Level Of Care: New/Established - Level 2 Electronic Signature(s) Signed: 12/01/2022 5:14:26 PM By: Adline Peals Entered By: Adline Peals on 12/01/2022 12:51:53 -------------------------------------------------------------------------------- Encounter Discharge Information Details Patient Name: Date of Service: [name], [name]. 12/01/2022 12:30 PM Medical Record Number: [id number] Patient Account Number: [id number] Date of  Birth/Sex: Treating RN: [birth year] (85 y.o. [name] Primary Care [name]: Jerene Bears Other Clinician: Referring Kru Allman: Treating Shevaun Lovan/Extender: Greta Doom Weeks in Treatment: 1 Encounter Discharge Information Items Discharge Condition: Stable Ambulatory Status: Ambulatory Discharge Destination: Home Transportation: Private Auto Accompanied By: self Schedule Follow-up Appointment: Yes Clinical Summary of Care: Patient Declined Electronic Signature(s) Signed: 12/01/2022 5:14:26 PM By: Sabas Sous By: Adline Peals on 12/01/2022 12:33:06 -------------------------------------------------------------------------------- Patient/Caregiver Education Details Patient Name: Date of Service: [name]. 12/18/2023andnbsp12:30 PM Medical Record Number: [id number] Patient Account Number: [id number] Date of Birth/Gender: Treating RN: May 03, 1936 (86 y.o. [name] Primary Care Physician: Jerene Bears Other Clinician: Referring Physician: Treating Physician/Extender: Samul Dada in Treatment: 1 Education Assessment Education Provided To: Patient Education Topics Provided Wound/Skin Impairment: Methods: Explain/Verbal Responses: Reinforcements needed, State content correctly Electronic Signature(s) Signed: 12/01/2022 5:14:26 PM By: Valeria Batman Gonzalez ([id number]) [id number] Page 4 of 5 Entered By: Adline Peals on 12/01/2022 12:32:34 -------------------------------------------------------------------------------- Wound Assessment Details Patient Name: Date of Service: [name], [name]. 12/01/2022 12:30 PM Medical Record Number: [id number] Patient Account Number: [id number] Date of Birth/Sex: Treating RN: [birth year] (86 y.o. [name] Primary Care [name]: Jerene Bears Other Clinician: Referring Annie Roseboom: Treating Terrall Bley/Extender:  Greta Doom Weeks in Treatment: 1 Wound Status Wound Number: 1 Primary Etiology: Diabetic Wound/Ulcer of the Lower Extremity Wound Location: Right Lower Leg Wound Status: Open Wounding Event: Skin Tear/Laceration Comorbid History: Type II Diabetes, Osteoarthritis Date Acquired: 11/19/2022 Weeks Of Treatment: 1 Clustered Wound: No Wound Measurements Length: (cm) 1.8 Width: (cm) 1.5 Depth: (cm) 0.2 Area: (cm) 2.121 Volume: (cm) 0.424 % Reduction in Area: 32.5% % Reduction in Volume: 32.5% Epithelialization: None Wound Description Classification: Grade 2 Wound Margin: Distinct, outline attached Exudate Amount: Medium Exudate Type: Serosanguineous Exudate Color: red, brown Foul Odor  After Cleansing: No Slough/Fibrino Yes Wound Bed Granulation Amount: Medium (34-66%) Exposed Structure Necrotic Amount: Medium (34-66%) Fascia Exposed: No Necrotic Quality: Eschar Fat Layer (Subcutaneous Tissue) Exposed: Yes Tendon Exposed: No Muscle Exposed: No Joint Exposed: No Bone Exposed: No Periwound Skin Texture Texture Color No Abnormalities Noted: No No Abnormalities Noted: No Erythema: Yes Moisture No Abnormalities Noted: No Treatment Notes Wound #1 (Lower Leg) Wound Laterality: Right Cleanser Soap and Water Discharge Instruction: May shower and wash wound with dial antibacterial soap and water prior to dressing change. Peri-Wound Care Sween Lotion (Moisturizing lotion) Discharge Instruction: Apply moisturizing lotion as directed Topical Primary Dressing IODOFLEX 0.9% Cadexomer Iodine Pad 4x6 cm Discharge Instruction: Apply to wound bed as instructed [name], [name] ([id number]) [id number] Page 5 of 5 Secondary Dressing ABD Pad, 5x9 Discharge Instruction: Apply over primary dressing as directed. Woven Gauze Sponge, Non-Sterile 4x4 in Discharge Instruction: Apply over primary dressing as directed. Secured With Compression  Wrap Kerlix Roll 4.5x3.1 (in/yd) Discharge Instruction: Apply Kerlix and Coban compression as directed. Coban Self-Adherent Wrap 4x5 (in/yd) Discharge Instruction: Apply over Kerlix as directed. Surgilast Tubular Elastic Stretch Net Dressing Size 4 Compression Stockings Add-Ons Electronic Signature(s) Signed: 12/01/2022 5:14:26 PM By: Adline Peals Entered By: Adline Peals on 12/01/2022 12:31:43 -------------------------------------------------------------------------------- Vitals Details Patient Name: Date of Service: [name], [name]. 12/01/2022 12:30 PM Medical Record Number: [id number] Patient Account Number: [id number] Date of Birth/Sex: Treating RN: 03-11-1936 (86 y.o. [name] Primary Care [name]: Jerene Bears Other Clinician: Referring [name]: Treating Shemeca Lukasik/Extender: Greta Doom Weeks in Treatment: 1 Vital Signs Time Taken: 12:33 Temperature (F): 97.6 Height (in): 62 Pulse (bpm): 67 Weight (lbs): 161 Respiratory Rate (breaths/min): 20 Body Mass Index (BMI): 29.4 Blood Pressure (mmHg): 190/94 Reference Range: 80 - 120 mg / dl Electronic Signature(s) Signed: 12/01/2022 5:14:26 PM By: Adline Peals Entered By: Adline Peals on 12/01/2022 12:38:25

## 2022-12-02 NOTE — Progress Notes (Signed)
CHASELYN, NANNEY ([id number]) [id number] Page 1 of 1 Visit Report for 12/01/2022 SuperBill Details Patient Name: Date of Service: [name] 12/01/2022 Medical Record Number: [id number] Patient Account Number: [id number] Date of Birth/Sex: Treating RN: 01/23/1936 (86 y.o. [name] Primary Care Provider: Jerene Bears Other Clinician: Referring Provider: Treating Provider/Extender: Greta Doom Weeks in Treatment: 1 Diagnosis Coding ICD-10 Codes Code Description [phone] Non-pressure chronic ulcer of right ankle with fat layer exposed Facility Procedures CPT4 Code Description Modifier Quantity [id number] - WOUND CARE VISIT-LEV 2 EST PT 1 Electronic Signature(s) Signed: 12/01/2022 5:06:55 PM By: Linton Ham MD Signed: 12/01/2022 5:14:26 PM By: Adline Peals Entered By: Adline Peals on 12/01/2022 12:52:02

## 2022-12-04 DIAGNOSIS — I10 Essential (primary) hypertension: Secondary | ICD-10-CM | POA: Diagnosis not present

## 2022-12-04 DIAGNOSIS — M79661 Pain in right lower leg: Secondary | ICD-10-CM | POA: Diagnosis not present

## 2022-12-04 DIAGNOSIS — Z299 Encounter for prophylactic measures, unspecified: Secondary | ICD-10-CM | POA: Diagnosis not present

## 2022-12-04 DIAGNOSIS — Z789 Other specified health status: Secondary | ICD-10-CM | POA: Diagnosis not present

## 2022-12-09 ENCOUNTER — Ambulatory Visit (HOSPITAL_BASED_OUTPATIENT_CLINIC_OR_DEPARTMENT_OTHER): Payer: Medicare Other | Admitting: General Surgery

## 2022-12-11 ENCOUNTER — Encounter (HOSPITAL_BASED_OUTPATIENT_CLINIC_OR_DEPARTMENT_OTHER): Payer: Medicare Other | Admitting: General Surgery

## 2022-12-11 DIAGNOSIS — L97812 Non-pressure chronic ulcer of other part of right lower leg with fat layer exposed: Secondary | ICD-10-CM | POA: Diagnosis not present

## 2022-12-11 DIAGNOSIS — Z833 Family history of diabetes mellitus: Secondary | ICD-10-CM | POA: Diagnosis not present

## 2022-12-11 DIAGNOSIS — L97312 Non-pressure chronic ulcer of right ankle with fat layer exposed: Secondary | ICD-10-CM | POA: Diagnosis not present

## 2022-12-11 DIAGNOSIS — E11622 Type 2 diabetes mellitus with other skin ulcer: Secondary | ICD-10-CM | POA: Diagnosis not present

## 2022-12-12 NOTE — Progress Notes (Signed)
Deborah, Gonzalez ([id number]) [id number] Page 1 of 8 Visit Report for 12/11/2022 Arrival Information Details Patient Name: Date of Service: [name] 12/11/2022 12:30 PM Medical Record Number: [id number] Patient Account Number: [id number] Date of Birth/Sex: Treating Gonzalez: [birth year] (86 y.o. [name], [name] [name]: [name] Other Clinician: Referring [name]: Treating [name]/Extender: Kathe Becton Gonzalez in Gonzalez: 3 Visit Information History Since Last Visit Added or deleted any medications: No Patient Arrived: Ambulatory Any new allergies or adverse reactions: No Arrival Time: 12:41 Had a fall or experienced change in No Accompanied By: son activities of daily living that may affect Transfer Assistance: None risk of falls: Signs or symptoms of abuse/neglect since last visito No Hospitalized since last visit: No Implantable device outside of the clinic excluding No cellular tissue based products placed in the center since last visit: Has Compression in Place as Prescribed: Yes Pain Present Now: Yes Electronic Signature(s) Signed: 12/12/2022 12:15:24 PM By: [name] Entered By: Deborah East on 12/11/2022 12:43:13 -------------------------------------------------------------------------------- Compression Therapy Details Patient Name: Date of Service: Deborah RT, [name] 12/11/2022 12:30 PM Medical Record Number: [id number] Patient Account Number: [id number] Date of Birth/Sex: Treating Gonzalez: [birth year] (86 y.o. [name] Primary Gonzalez [name]: [name] Other Clinician: Referring [name]: Treating Jadi Deyarmin/Extender: Kathe Becton Gonzalez in Gonzalez: 3 Compression Therapy Performed for Wound Assessment: Wound #1 Right Lower Leg Performed By: Clinician Deborah East, Gonzalez Compression Type: Three Layer Post Procedure Diagnosis Same as Pre-procedure Electronic  Signature(s) Signed: 12/11/2022 4:23:18 PM By: [name] Entered By: Deborah East on 12/11/2022 16:23:18 [name] ([id number]) [id number] Page 2 of 8 -------------------------------------------------------------------------------- Encounter Discharge Information Details Patient Name: Date of Service: [name] 12/11/2022 12:30 PM Medical Record Number: [id number] Patient Account Number: [id number] Date of Birth/Sex: Treating Gonzalez: 11-03-1936 (86 y.o. [name] Primary Gonzalez [name]: [name] Other Clinician: Referring [name]: Treating [name]/Extender: Kathe Becton Gonzalez in Gonzalez: 3 Encounter Discharge Information Items Post Procedure Vitals Discharge Condition: Stable Temperature (F): 98.1 Ambulatory Status: Ambulatory Pulse (bpm): 93 Discharge Destination: Home Respiratory Rate (breaths/min): 16 Transportation: Private Auto Blood Pressure (mmHg): 200/89 Accompanied By: son Schedule Follow-up Appointment: Yes Clinical Summary of Gonzalez: Electronic Signature(s) Signed: 12/11/2022 4:18:15 PM By: [name] Entered By: Deborah East on 12/11/2022 16:18:15 -------------------------------------------------------------------------------- Lower Extremity Assessment Details Patient Name: Date of Service: Deborah RT, Deborah Pane Gonzalez. 12/11/2022 12:30 PM Medical Record Number: [id number] Patient Account Number: [id number] Date of Birth/Sex: Treating Gonzalez: 25-Jan-1936 (86 y.o. [name] Primary Gonzalez Owyn Raulston: [name] Other Clinician: Referring [name]: Treating [name]/Extender: [name] in Gonzalez: 3 Edema Assessment Assessed: [Left: No] [Right: No] [Left: Edema] [Right: :] Calf Left: Right: Point of Measurement: 28 cm From Medial Instep 32 cm Ankle Left: Right: Point of Measurement: 11 cm From Medial Instep 19.5 cm Vascular Assessment Pulses: Dorsalis  Pedis Palpable: [Right:Yes] Electronic Signature(s) Signed: 12/12/2022 12:15:24 PM By: [name] Entered By: Deborah East on 12/11/2022 12:55:00 Multi Wound Chart Details -------------------------------------------------------------------------------- [name] ([id number]) [id number] Page 3 of 8 Patient Name: Date of Service: Deborah RT, [name] 12/11/2022 12:30 PM Medical Record Number: [id number] Patient Account Number: [id number] Date of Birth/Sex: Treating Gonzalez: June 11, 1936 (86 y.o. F) Primary Gonzalez [name]: [name] Other Clinician: Referring Shey Bartmess: Treating Jovannie Ulibarri/Extender: [name] in Gonzalez: 3 Vital Signs Height(in): 62 Capillary Blood Glucose(mg/dl): 147 Weight(lbs): 161 Pulse(bpm):  93 Body Mass Index(BMI): 29.4 Blood Pressure(mmHg): Temperature(F): 98.1 Respiratory Rate(breaths/min): 16 Wound Assessments Wound Number: 1 N/A N/A Photos: N/A N/A Right Lower Leg N/A N/A Wound Location: Skin T ear/Laceration N/A N/A Wounding Event: Diabetic Wound/Ulcer of the Lower N/A N/A Primary Etiology: Extremity Type II Diabetes, Osteoarthritis N/A N/A Comorbid History: 11/19/2022 N/A N/A Date Acquired: 3 N/A N/A Gonzalez of Gonzalez: Open N/A N/A Wound Status: No N/A N/A Wound Recurrence: 2.4x1.1x0.2 N/A N/A Measurements L x W x Gonzalez (cm) 2.073 N/A N/A A (cm) : rea 0.415 N/A N/A Volume (cm) : 34.00% N/A N/A % Reduction in A rea: 33.90% N/A N/A % Reduction in Volume: Grade 2 N/A N/A Classification: Medium N/A N/A Exudate A mount: Serosanguineous N/A N/A Exudate Type: red, brown N/A N/A Exudate Color: Distinct, outline attached N/A N/A Wound Margin: Medium (34-66%) N/A N/A Granulation A mount: Medium (34-66%) N/A N/A Necrotic A mount: Eschar N/A N/A Necrotic Tissue: Fat Layer (Subcutaneous Tissue): Yes N/A N/A Exposed Structures: Fascia: No Tendon: No Muscle: No Joint:  No Bone: No None N/A N/A Epithelialization: Debridement - Selective/Open Wound N/A N/A Debridement: Pre-procedure Verification/Time Out 13:04 N/A N/A Taken: Lidocaine 4% Topical Solution N/A N/A Pain Control: Slough N/A N/A Tissue Debrided: Non-Viable Tissue N/A N/A Level: 2.64 N/A N/A Debridement A (sq cm): rea Curette N/A N/A Instrument: Minimum N/A N/A Bleeding: Pressure N/A N/A Hemostasis A chieved: Procedure was tolerated well N/A N/A Debridement Gonzalez Response: 2.4x1.1x0.2 N/A N/A Post Debridement Measurements L x W x Gonzalez (cm) 0.415 N/A N/A Post Debridement Volume: (cm) Maceration: No N/A N/A Periwound Skin Moisture: Dry/Scaly: No Erythema: Yes N/A N/A Periwound Skin Color: Debridement N/A N/A Procedures Performed: Gonzalez Notes Wound #1 (Lower Leg) Wound Laterality: Right Cleanser Soap and Water Discharge Instruction: May shower and wash wound with dial antibacterial soap and water prior to dressing change. [name], [name] ([id number]) [id number] Page 4 of 8 Peri-Wound Gonzalez Sween Lotion (Moisturizing lotion) Discharge Instruction: Apply moisturizing lotion as directed Topical Primary Dressing IODOFLEX 0.9% Cadexomer Iodine Pad 4x6 cm Discharge Instruction: Apply to wound bed as instructed Secondary Dressing ABD Pad, 5x9 Discharge Instruction: Apply over primary dressing as directed. Woven Gauze Sponge, Non-Sterile 4x4 in Discharge Instruction: Apply over primary dressing as directed. Secured With Elastic Bandage 4 inch (ACE bandage) Discharge Instruction: Secure with ACE bandage as directed. Compression Wrap Kerlix Roll 4.5x3.1 (in/yd) Discharge Instruction: Apply Kerlix and Coban compression as directed. Surgilast Tubular Elastic Stretch Net Dressing Size 4 Compression Stockings Add-Ons Electronic Signature(s) Signed: 12/11/2022 1:28:10 PM By: Fredirick Maudlin MD FACS Entered By: Fredirick Maudlin on 12/11/2022  13:28:10 -------------------------------------------------------------------------------- Multi-Disciplinary Gonzalez Plan Details Patient Name: Date of Service: Deborah RT, [name] 12/11/2022 12:30 PM Medical Record Number: [id number] Patient Account Number: [id number] Date of Birth/Sex: Treating Gonzalez: July 15, 1936 (86 y.o. [name] Primary Gonzalez Aqsa Sensabaugh: [name] Other Clinician: Referring Barack Nicodemus: Treating Johnae Friley/Extender: [name] in Gonzalez: 3 Active Inactive Wound/Skin Impairment Nursing Diagnoses: Impaired tissue integrity Knowledge deficit related to ulceration/compromised skin integrity Goals: Patient/caregiver will verbalize understanding of skin Gonzalez regimen Date Initiated: 11/19/2022 Target Resolution Date: 12/24/2022 Goal Status: Active Ulcer/skin breakdown will have a volume reduction of 30% by week 4 Date Initiated: 11/19/2022 Target Resolution Date: 12/31/2022 Goal Status: Active Interventions: Assess patient/caregiver ability to obtain necessary supplies Assess patient/caregiver ability to perform ulcer/skin Gonzalez regimen upon admission and as needed Assess ulceration(s) every visit [name], [name] ([id number]) [phone].pdf Page 5 of 8 Provide education on ulcer and skin  Gonzalez Notes: Electronic Signature(s) Signed: 12/12/2022 12:15:24 PM By: [name] Entered By: Deborah East on 12/11/2022 12:59:08 -------------------------------------------------------------------------------- Pain Assessment Details Patient Name: Date of Service: Deborah RT, Deborah Pane Gonzalez. 12/11/2022 12:30 PM Medical Record Number: [id number] Patient Account Number: [id number] Date of Birth/Sex: Treating Gonzalez: Aug 25, 1936 (86 y.o. [name] Primary Gonzalez Nylen Creque: [name] Other Clinician: Referring Lysette Lindenbaum: Treating Saroya Riccobono/Extender: Kathe Becton Gonzalez in Gonzalez: 3 Active Problems Location of Pain  Severity and Description of Pain Patient Has Paino Yes Site Locations Pain Location: Pain in Ulcers Rate the pain. Current Pain Level: 1 Worst Pain Level: 8 Character of Pain Describe the Pain: Sharp, Throbbing Pain Management and Medication Current Pain Management: Electronic Signature(s) Signed: 12/12/2022 12:15:24 PM By: [name] Entered By: Deborah East on 12/11/2022 12:45:44 -------------------------------------------------------------------------------- Patient/Caregiver Education Details Patient Name: Date of Service: [name]. 12/28/2023andnbsp12:30 PM Medical Record Number: [id number] Patient Account Number: [id number] Date of Birth/Gender: Treating Gonzalez: May 14, 1936 (86 y.o. [name] Primary Gonzalez Physician: [name] Other Clinician: Referring Physician: Treating Physician/Extender: Kathe Becton [place], Minna Merritts Gonzalez ([id number]) [id number] Page 6 of 8 Gonzalez in Gonzalez: 3 Education Assessment Education Provided To: Patient Education Topics Provided Wound Debridement: Methods: Explain/Verbal Responses: State content correctly Wound/Skin Impairment: Methods: Explain/Verbal Responses: Reinforcements needed Electronic Signature(s) Signed: 12/12/2022 12:15:24 PM By: [name] Entered By: Deborah East on 12/11/2022 12:59:28 -------------------------------------------------------------------------------- Wound Assessment Details Patient Name: Date of Service: Deborah RT, [name] 12/11/2022 12:30 PM Medical Record Number: [id number] Patient Account Number: [id number] Date of Birth/Sex: Treating Gonzalez: 08-14-1936 (86 y.o. [name], [name] Reinhard Schack: [name] Other Clinician: Referring Kada Friesen: Treating Melchizedek Espinola/Extender: [name] in Gonzalez: 3 Wound Status Wound Number: 1 Primary Etiology: Diabetic Wound/Ulcer of the Lower Extremity Wound Location:  Right Lower Leg Wound Status: Open Wounding Event: Skin Tear/Laceration Comorbid History: Type II Diabetes, Osteoarthritis Date Acquired: 11/19/2022 Gonzalez Of Gonzalez: 3 Clustered Wound: No Photos Wound Measurements Length: (cm) 2.4 Width: (cm) 1.1 Depth: (cm) 0.2 Area: (cm) 2.073 Volume: (cm) 0.415 % Reduction in Area: 34% % Reduction in Volume: 33.9% Epithelialization: None Tunneling: No Undermining: No Wound Description Classification: Grade 2 Wound Margin: Distinct, outline attached Exudate Amount: Medium Exudate Type: Serosanguineous [name], [name] ([id number]) Exudate Color: red, brown Foul Odor After Cleansing: No Slough/Fibrino Yes [id number] Page 7 of 8 Wound Bed Granulation Amount: Medium (34-66%) Exposed Structure Necrotic Amount: Medium (34-66%) Fascia Exposed: No Necrotic Quality: Eschar Fat Layer (Subcutaneous Tissue) Exposed: Yes Tendon Exposed: No Muscle Exposed: No Joint Exposed: No Bone Exposed: No Periwound Skin Texture Texture Color No Abnormalities Noted: No No Abnormalities Noted: No Erythema: Yes Moisture No Abnormalities Noted: No Dry / Scaly: No Maceration: No Gonzalez Notes Wound #1 (Lower Leg) Wound Laterality: Right Cleanser Soap and Water Discharge Instruction: May shower and wash wound with dial antibacterial soap and water prior to dressing change. Peri-Wound Gonzalez Sween Lotion (Moisturizing lotion) Discharge Instruction: Apply moisturizing lotion as directed Topical Primary Dressing IODOFLEX 0.9% Cadexomer Iodine Pad 4x6 cm Discharge Instruction: Apply to wound bed as instructed Secondary Dressing ABD Pad, 5x9 Discharge Instruction: Apply over primary dressing as directed. Woven Gauze Sponge, Non-Sterile 4x4 in Discharge Instruction: Apply over primary dressing as directed. Secured With Elastic Bandage 4 inch (ACE bandage) Discharge Instruction: Secure with ACE bandage as  directed. Compression Wrap Kerlix Roll 4.5x3.1 (in/yd) Discharge Instruction: Apply Kerlix and Coban compression as directed. Surgilast Tubular Careers adviser  Dressing Size 4 Compression Stockings Add-Ons Electronic Signature(s) Signed: 12/12/2022 12:15:24 PM By: [name] Entered By: Deborah East on 12/11/2022 12:57:40 -------------------------------------------------------------------------------- Vitals Details Patient Name: Date of Service: Deborah RT, [name] 12/11/2022 12:30 PM Medical Record Number: [id number] Patient Account Number: [id number] Date of Birth/Sex: Treating Gonzalez: 1935/12/31 (86 y.o. [name], [name] Primary Gonzalez Carlton Sweaney: [name] Other Clinician: Madie Gonzalez ([id number]) [id number] Page 8 of 8 Referring Georgena Weisheit: Treating Tamzin Bertling/Extender: [name] in Gonzalez: 3 Vital Signs Time Taken: 12:43 Temperature (F): 98.1 Height (in): 62 Pulse (bpm): 93 Weight (lbs): 161 Respiratory Rate (breaths/min): 16 Body Mass Index (BMI): 29.4 Blood Pressure (mmHg): 200/89 Capillary Blood Glucose (mg/dl): 147 Reference Range: 80 - 120 mg / dl Notes BP elevated today in clinic; pt asymptomatic, no c/o of dizziness or lightheadedness, pt claims BP is always elevated in the clinic Gonzalez/t nerves. Stated she took medication for BP this morning. Son at bedside. Electronic Signature(s) Signed: 12/11/2022 4:16:23 PM By: [name] Entered By: Deborah East on 12/11/2022 16:16:23

## 2022-12-12 NOTE — Progress Notes (Signed)
[name], [name] ([id number]) [id number] Page 1 of 7 Visit Report for 12/11/2022 Chief Complaint Document Details Patient Name: Date of Service: [place] 12/11/2022 12:Gonzalez PM Medical Record Number: [id number] Patient Account Number: [id number] Date of Birth/Sex: Treating RN: [name] (86 y.o. F) Primary Care Provider: Jerene Bears Other Clinician: Referring Provider: Treating Provider/Extender: Kathe Becton Weeks in Treatment: 3 Information Obtained from: Patient Chief Complaint Patient seen for complaints of Non-Healing Wound. Electronic Signature(s) Signed: 12/11/2022 1:28:16 PM By: Fredirick Maudlin MD FACS Entered By: Fredirick Maudlin on 12/11/2022 13:28:16 -------------------------------------------------------------------------------- Debridement Details Patient Name: Date of Service: [name], [name]. 12/11/2022 12:Gonzalez PM Medical Record Number: [id number] Patient Account Number: [id number] Date of Birth/Sex: Treating RN: [name], [name] (86 y.o. [name], [name] Primary Care Provider: Jerene Bears Other Clinician: Referring Provider: Treating Provider/Extender: Kathe Becton Weeks in Treatment: 3 Debridement Performed for Assessment: Wound #1 Right Lower Leg Performed By: Physician Fredirick Maudlin, MD Debridement Type: Debridement Severity of Tissue Pre Debridement: Fat layer exposed Level of Consciousness (Pre-procedure): Awake and Alert Pre-procedure Verification/Time Out Yes - 13:04 Taken: Start Time: 13:05 Pain Control: Lidocaine 4% T opical Solution T Area Debrided (L x W): otal 2.4 (cm) x 1.1 (cm) = 2.64 (cm) Tissue and other material debrided: Non-Viable, Slough, Slough Level: Non-Viable Tissue Debridement Description: Selective/Open Wound Instrument: Curette Bleeding: Minimum Hemostasis Achieved: Pressure Response to Treatment: Procedure was tolerated well Level of Consciousness (Post- Awake  and Alert procedure): Post Debridement Measurements of Total Wound Length: (cm) 2.4 Width: (cm) 1.1 Depth: (cm) 0.2 Volume: (cm) 0.415 Character of Wound/Ulcer Post Debridement: Requires Further Debridement Severity of Tissue Post Debridement: Fat layer exposed Post Procedure Diagnosis Same as Pre-procedure [name] ([id number]) [id number] Page 2 of 7 Notes Scribed for Dr. Celine Gonzalez by Blanche East, RN Electronic Signature(s) Signed: 12/11/2022 4:02:50 PM By: Fredirick Maudlin MD FACS Signed: 12/12/2022 12:15:24 PM By: Blanche East RN Entered By: Blanche East on 12/11/2022 13:07:03 -------------------------------------------------------------------------------- HPI Details Patient Name: Date of Service: [name], [name]. 12/11/2022 12:Gonzalez PM Medical Record Number: [id number] Patient Account Number: [id number] Date of Birth/Sex: Treating RN: [name] (86 y.o. F) Primary Care Provider: Jerene Bears Other Clinician: Referring Provider: Treating Provider/Extender: Milas Kocher, Dhruv Weeks in Treatment: 3 History of Present Illness HPI Description: ADMISSION 11/19/2022 This is an 86 year old woman who was apparently diagnosed with cellulitis in October and placed on doxycycline. She was on doxycycline for over Deborah month. She developed an ulcer on her right ankle. It apparently expanded fairly dramatically over the course of about Deborah week and as Deborah result, she sought treatment at Deborah local emergency room. She was seen on November 16, 2022. At that point, the doxycycline was discontinued and clindamycin prescribed. She was referred to the wound care center for further evaluation and management. She says that since starting the clindamycin, the erythema in her leg has resolved almost completely. She has Deborah triangular wound on her right lower leg, just medial to the anterior tibial surface. There is Deborah thick layer of eschar on the surface. There is no  malodor or purulent drainage. Periwound skin is intact. 12/13; this is Deborah patient with Deborah wound on the right lower extremity. We have been using Iodoflex under 3 layer compression. She is completing her clindamycin 12/11/2022: The patient has been experiencing more pain in her leg, lateral to the wound and up towards her knee; she states the wound itself does not hurt. Secondary to  this discomfort, she removed her compression wrap. The wound is Deborah little bit smaller today and has just Deborah bit of slough and eschar on the surface. Electronic Signature(s) Signed: 12/11/2022 1:29:34 PM By: Fredirick Maudlin MD FACS Entered By: Fredirick Maudlin on 12/11/2022 13:29:33 -------------------------------------------------------------------------------- Physical Exam Details Patient Name: Date of Service: [name], [name]. 12/11/2022 12:Gonzalez PM Medical Record Number: [id number] Patient Account Number: [id number] Date of Birth/Sex: Treating RN: [name] (86 y.o. F) Primary Care Provider: Jerene Bears Other Clinician: Referring Provider: Treating Provider/Extender: Milas Kocher, Dhruv Weeks in Treatment: 3 Constitutional . . . No acute distress. Respiratory Normal work of breathing on room air. Notes 12/11/2022: The wound is Deborah little bit smaller today and has just Deborah bit of slough and eschar on the surface. [name], [name] ([id number]) [id number] Page 3 of 7 Electronic Signature(s) Signed: 12/11/2022 1:Gonzalez:20 PM By: Fredirick Maudlin MD FACS Entered By: Fredirick Maudlin on 12/11/2022 13:Gonzalez:20 -------------------------------------------------------------------------------- Physician Orders Details Patient Name: Date of Service: [name], [name]. 12/11/2022 12:Gonzalez PM Medical Record Number: [id number] Patient Account Number: [id number] Date of Birth/Sex: Treating RN: [birth year], [name] (86 y.o. [name] Primary Care Provider: Jerene Bears Other Clinician: Referring  Provider: Treating Provider/Extender: Kathe Becton Weeks in Treatment: 3 Verbal / Phone Orders: No Diagnosis Coding ICD-10 Coding Code Description U23.536 Non-pressure chronic ulcer of right ankle with fat layer exposed Follow-up Appointments ppointment in 1 week. - Dr. Celine Gonzalez - Room 1 Return Deborah Anesthetic Wound #1 Right Lower Leg (In clinic) Topical Lidocaine 4% applied to wound bed Edema Control - Lymphedema / SCD / Other Right Lower Extremity Avoid standing for long periods of time. Wound Treatment Wound #1 - Lower Leg Wound Laterality: Right Cleanser: Soap and Water Discharge Instructions: May shower and wash wound with dial antibacterial soap and water prior to dressing change. Peri-Wound Care: Sween Lotion (Moisturizing lotion) Discharge Instructions: Apply moisturizing lotion as directed Prim Dressing: IODOFLEX 0.9% Cadexomer Iodine Pad 4x6 cm ary Discharge Instructions: Apply to wound bed as instructed Secondary Dressing: ABD Pad, 5x9 Discharge Instructions: Apply over primary dressing as directed. Secondary Dressing: Woven Gauze Sponge, Non-Sterile 4x4 in Discharge Instructions: Apply over primary dressing as directed. Secured With: Elastic Bandage 4 inch (ACE bandage) Discharge Instructions: Secure with ACE bandage as directed. Compression Wrap: Kerlix Roll 4.5x3.1 (in/yd) Discharge Instructions: Apply Kerlix and Coban compression as directed. Compression Wrap: Surgilast Tubular Elastic Stretch Net Dressing Size 4 Electronic Signature(s) Signed: 12/11/2022 4:02:50 PM By: Fredirick Maudlin MD FACS Entered By: Fredirick Maudlin on 12/11/2022 13:Gonzalez:36 [name] ([id number]) [phone].pdf Page 4 of 7 -------------------------------------------------------------------------------- Problem List Details Patient Name: Date of Service: [name], Deborah Pane Gonzalez. 12/11/2022 12:Gonzalez PM Medical Record Number: [id number] Patient Account  Number: [id number] Date of Birth/Sex: Treating RN: [name]-[birth year] (86 y.o. F) Primary Care Provider: Jerene Bears Other Clinician: Referring Provider: Treating Provider/Extender: Milas Kocher, Dhruv Weeks in Treatment: 3 Active Problems ICD-10 Encounter Code Description Active Date MDM Diagnosis L97.312 Non-pressure chronic ulcer of right ankle with fat layer exposed 11/19/2022 No Yes Inactive Problems Resolved Problems Electronic Signature(s) Signed: 12/11/2022 1:28:04 PM By: Fredirick Maudlin MD FACS Entered By: Fredirick Maudlin on 12/11/2022 13:28:03 -------------------------------------------------------------------------------- Progress Note Details Patient Name: Date of Service: [name], [name]. 12/11/2022 12:Gonzalez PM Medical Record Number: [id number] Patient Account Number: [id number] Date of Birth/Sex: Treating RN: 08[birth year]-[name] (86 y.o. F) Primary Care Provider: Jerene Bears Other Clinician: Referring Provider: Treating Provider/Extender: Milas Kocher, Dhruv [name]  in Treatment: 3 Subjective Chief Complaint Information obtained from Patient Patient seen for complaints of Non-Healing Wound. History of Present Illness (HPI) ADMISSION 11/19/2022 This is an 86 year old woman who was apparently diagnosed with cellulitis in October and placed on doxycycline. She was on doxycycline for over Deborah month. She developed an ulcer on her right ankle. It apparently expanded fairly dramatically over the course of about Deborah week and as Deborah result, she sought treatment at Deborah local emergency room. She was seen on November 16, 2022. At that point, the doxycycline was discontinued and clindamycin prescribed. She was referred to the wound care center for further evaluation and management. She says that since starting the clindamycin, the erythema in her leg has resolved almost completely. She has Deborah triangular wound on her right lower leg, just medial to the anterior tibial surface. There is Deborah  thick layer of eschar on the surface. There is no malodor or purulent drainage. Periwound skin is intact. 12/13; this is Deborah patient with Deborah wound on the right lower extremity. We have been using Iodoflex under 3 layer compression. She is completing her clindamycin 12/11/2022: The patient has been experiencing more pain in her leg, lateral to the wound and up towards her knee; she states the wound itself does not hurt. Secondary to this discomfort, she removed her compression wrap. The wound is Deborah little bit smaller today and has just Deborah bit of slough and eschar on the surface. Patient History Information obtained from Patient. [name], [name] ([id number]) [id number] Page 5 of 7 Family History Cancer - Maternal Grandparents,Father, Diabetes - Paternal Grandparents,Father, Heart Disease - Mother, Hypertension - Mother, Stroke - Paternal Grandparents, Thyroid Problems - Mother,Child, No family history of Seizures. Social History Never smoker, Marital Status - Divorced, Alcohol Use - Never, Drug Use - No History, Caffeine Use - Daily. Medical History Endocrine Patient has history of Type II Diabetes Musculoskeletal Patient has history of Osteoarthritis Objective Constitutional No acute distress. Vitals Time Taken: 12:43 PM, Height: 62 in, Weight: 161 lbs, BMI: 29.4, Temperature: 98.1 F, Pulse: 93 bpm, Respiratory Rate: 16 breaths/min, Capillary Blood Glucose: 147 mg/dl. Respiratory Normal work of breathing on room air. General Notes: 12/11/2022: The wound is Deborah little bit smaller today and has just Deborah bit of slough and eschar on the surface. Integumentary (Hair, Skin) Wound #1 status is Open. Original cause of wound was Skin T ear/Laceration. The date acquired was: 11/19/2022. The wound has been in treatment 3 weeks. The wound is located on the Right Lower Leg. The wound measures 2.4cm length x 1.1cm width x 0.2cm depth; 2.073cm^2 area and 0.415cm^3 volume. There  is Fat Layer (Subcutaneous Tissue) exposed. There is no tunneling or undermining noted. There is Deborah medium amount of serosanguineous drainage noted. The wound margin is distinct with the outline attached to the wound base. There is medium (34-66%) granulation within the wound bed. There is Deborah medium (34-66%) amount of necrotic tissue within the wound bed including Eschar. The periwound skin appearance exhibited: Erythema. The periwound skin appearance did not exhibit: Dry/Scaly, Maceration. The surrounding wound skin color is noted with erythema. Assessment Active Problems ICD-10 Non-pressure chronic ulcer of right ankle with fat layer exposed Procedures Wound #1 Pre-procedure diagnosis of Wound #1 is Deborah Diabetic Wound/Ulcer of the Lower Extremity located on the Right Lower Leg .Severity of Tissue Pre Debridement is: Fat layer exposed. There was Deborah Selective/Open Wound Non-Viable Tissue Debridement with Deborah total area of 2.64 sq cm performed by Fredirick Maudlin,  MD. With the following instrument(s): Curette to remove Non-Viable tissue/material. Material removed includes [facility] after achieving pain control using Lidocaine 4% T opical Solution. No specimens were taken. Deborah time out was conducted at 13:04, prior to the start of the procedure. Deborah Minimum amount of bleeding was controlled with Pressure. The procedure was tolerated well. Post Debridement Measurements: 2.4cm length x 1.1cm width x 0.2cm depth; 0.415cm^3 volume. Character of Wound/Ulcer Post Debridement requires further debridement. Severity of Tissue Post Debridement is: Fat layer exposed. Post procedure Diagnosis Wound #1: Same as Pre-Procedure General Notes: Scribed for Dr. Celine Gonzalez by Blanche East, RN. Plan Follow-up Appointments: Return Appointment in 1 week. - Dr. Celine Gonzalez - Room 1 Anesthetic: Wound #1 Right Lower Leg: (In clinic) Topical Lidocaine 4% applied to wound bed Edema Control - Lymphedema / SCD / OtherARYANNA, [name]  ([id number]) [id number] Page 6 of 7 Avoid standing for long periods of time. WOUND #1: - Lower Leg Wound Laterality: Right Cleanser: Soap and Water Discharge Instructions: May shower and wash wound with dial antibacterial soap and water prior to dressing change. Peri-Wound Care: Sween Lotion (Moisturizing lotion) Discharge Instructions: Apply moisturizing lotion as directed Prim Dressing: IODOFLEX 0.9% Cadexomer Iodine Pad 4x6 cm ary Discharge Instructions: Apply to wound bed as instructed Secondary Dressing: ABD Pad, 5x9 Discharge Instructions: Apply over primary dressing as directed. Secondary Dressing: Woven Gauze Sponge, Non-Sterile 4x4 in Discharge Instructions: Apply over primary dressing as directed. Secured With: Elastic Bandage 4 inch (ACE bandage) Discharge Instructions: Secure with ACE bandage as directed. Com pression Wrap: Kerlix Roll 4.5x3.1 (in/yd) Discharge Instructions: Apply Kerlix and Coban compression as directed. Com pression Wrap: Surgilast Tubular Elastic Stretch Net Dressing Size 4 12/11/2022: The wound is Deborah little bit smaller today and has just Deborah bit of slough and eschar on the surface. I used Deborah curette to debride slough and eschar from the wound. We will continue with Iodoflex. We will try Kerlix and Ace bandage compression to see if she tolerates this any better. I am hopeful that at her next visit, the wound will be clean enough that we can transition to Deborah different contact layer, such as silver alginate or silver collagen. Follow-up in 1 week. Electronic Signature(s) Signed: 12/11/2022 1:31:22 PM By: Fredirick Maudlin MD FACS Entered By: Fredirick Maudlin on 12/11/2022 13:31:22 -------------------------------------------------------------------------------- HxROS Details Patient Name: Date of Service: [name], [name]. 12/11/2022 12:Gonzalez PM Medical Record Number: [id number] Patient Account Number: [id number] Date of Birth/Sex:  Treating RN: 25-Mar-[name] (86 y.o. F) Primary Care Provider: Jerene Bears Other Clinician: Referring Provider: Treating Provider/Extender: Kathe Becton Weeks in Treatment: 3 Information Obtained From Patient Endocrine Medical History: Positive for: Type II Diabetes Time with diabetes: Gonzalez yrs ago Blood sugar tested every day: Yes Tested : Musculoskeletal Medical History: Positive for: Osteoarthritis Immunizations Pneumococcal Vaccine: Received Pneumococcal Vaccination: No Implantable Devices None Family and Social History Cancer: Yes - Maternal Grandparents,Father; Diabetes: Yes - Paternal Grandparents,Father; Heart Disease: Yes - Mother; Hypertension: Yes - Mother; Seizures: No; Stroke: Yes - Paternal Grandparents; Thyroid Problems: Yes - Mother,Child; Never smoker; Marital Status - Divorced; Alcohol Use: Never; Drug Use: No History; Caffeine Use: Daily; Financial Concerns: No; Food, Clothing or Shelter Needs: No; Support System Lacking: No; Transportation Concerns: No [name], [name] ([id number]) [id number] Page 7 of 7 Electronic Signature(s) Signed: 12/11/2022 4:02:50 PM By: Fredirick Maudlin MD FACS Entered By: Fredirick Maudlin on 12/11/2022 13:29:38 -------------------------------------------------------------------------------- SuperBill Details Patient Name: Date of Service: [name], [name].  12/11/2022 Medical Record Number: [id number] Patient Account Number: [id number] Date of Birth/Sex: Treating RN: [name]-04[birth year] (86 y.o. F) Primary Care Provider: Jerene Bears Other Clinician: Referring Provider: Treating Provider/Extender: Milas Kocher, Dhruv Weeks in Treatment: 3 Diagnosis Coding ICD-10 Codes Code Description [phone] Non-pressure chronic ulcer of right ankle with fat layer exposed Facility Procedures : 7 CPT4 Code: [id number] Description: 18841 - DEBRIDE WOUND 1ST 20 SQ CM OR < ICD-10 Diagnosis Description Y60.630  Non-pressure chronic ulcer of right ankle with fat layer exposed Modifier: Quantity: 1 Physician Procedures : CPT4 Code Description Modifier [id number] - WC PHYS LEVEL 3 - EST PT 25 ICD-10 Diagnosis Description D22.025 Non-pressure chronic ulcer of right ankle with fat layer exposed Quantity: 1 : [id number] - WC PHYS DEBR WO ANESTH 20 SQ CM ICD-10 Diagnosis Description T51.761 Non-pressure chronic ulcer of right ankle with fat layer exposed Quantity: 1 Electronic Signature(s) Signed: 12/11/2022 1:31:35 PM By: Fredirick Maudlin MD FACS Entered By: Fredirick Maudlin on 12/11/2022 13:31:34

## 2022-12-17 DIAGNOSIS — I10 Essential (primary) hypertension: Secondary | ICD-10-CM | POA: Diagnosis not present

## 2022-12-17 DIAGNOSIS — Z789 Other specified health status: Secondary | ICD-10-CM | POA: Diagnosis not present

## 2022-12-17 DIAGNOSIS — L98499 Non-pressure chronic ulcer of skin of other sites with unspecified severity: Secondary | ICD-10-CM | POA: Diagnosis not present

## 2022-12-17 DIAGNOSIS — Z299 Encounter for prophylactic measures, unspecified: Secondary | ICD-10-CM | POA: Diagnosis not present

## 2022-12-23 ENCOUNTER — Encounter (HOSPITAL_BASED_OUTPATIENT_CLINIC_OR_DEPARTMENT_OTHER): Payer: Medicare Other | Attending: General Surgery | Admitting: General Surgery

## 2022-12-23 DIAGNOSIS — E11621 Type 2 diabetes mellitus with foot ulcer: Secondary | ICD-10-CM | POA: Insufficient documentation

## 2022-12-23 DIAGNOSIS — L97312 Non-pressure chronic ulcer of right ankle with fat layer exposed: Secondary | ICD-10-CM | POA: Diagnosis not present

## 2022-12-23 DIAGNOSIS — Z833 Family history of diabetes mellitus: Secondary | ICD-10-CM | POA: Insufficient documentation

## 2022-12-23 DIAGNOSIS — I890 Lymphedema, not elsewhere classified: Secondary | ICD-10-CM | POA: Diagnosis not present

## 2022-12-23 DIAGNOSIS — E11622 Type 2 diabetes mellitus with other skin ulcer: Secondary | ICD-10-CM | POA: Diagnosis not present

## 2022-12-23 DIAGNOSIS — L97812 Non-pressure chronic ulcer of other part of right lower leg with fat layer exposed: Secondary | ICD-10-CM | POA: Diagnosis not present

## 2022-12-23 NOTE — Progress Notes (Signed)
Deborah, GODSIL ([id number]) [id number] Page 1 of 8 Visit Report for 12/23/2022 Arrival Information Details Patient Name: Date of Service: [name]. 12/23/2022 2:30 PM Medical Record Number: [id number] Patient Account Number: [id number] Date of Birth/Sex: Treating RN: June 01, 1936 (87 y.o. F) Primary Care Johnathin Vanderschaaf: Jerene Bears Other Clinician: Referring Donye Campanelli: Treating Zamyra Allensworth/Extender: Kathe Becton Weeks in Treatment: 4 Visit Information History Since Last Visit All ordered tests and consults were completed: No Patient Arrived: Ambulatory Added or deleted any medications: No Arrival Time: 14:39 Any new allergies or adverse reactions: No Accompanied By: self Had a fall or experienced change in No Transfer Assistance: None activities of daily living that may affect Patient Identification Verified: Yes risk of falls: Secondary Verification Process Completed: Yes Signs or symptoms of abuse/neglect since last visito No Hospitalized since last visit: No Implantable device outside of the clinic excluding No cellular tissue based products placed in the center since last visit: Pain Present Now: No Electronic Signature(s) Signed: 12/23/2022 3:43:16 PM By: Worthy Rancher Entered By: Worthy Rancher on 12/23/2022 14:40:18 -------------------------------------------------------------------------------- Encounter Discharge Information Details Patient Name: Date of Service: Deborah RT, [name] 12/23/2022 2:30 PM Medical Record Number: [id number] Patient Account Number: [id number] Date of Birth/Sex: Treating RN: 03/18/1936 (87 y.o. Debby Bud Primary Care Race Latour: Jerene Bears Other Clinician: Referring Zanaiya Calabria: Treating Kathlen Sakurai/Extender: Kathe Becton Weeks in Treatment: 4 Encounter Discharge Information Items Post Procedure Vitals Discharge Condition: Stable Temperature (F): 98.2 Ambulatory Status: Ambulatory Pulse  (bpm): 87 Discharge Destination: Home Respiratory Rate (breaths/min): 20 Transportation: Private Auto Blood Pressure (mmHg): 194/89 Accompanied By: self Schedule Follow-up Appointment: Yes Clinical Summary of Care: Electronic Signature(s) Signed: 12/23/2022 3:54:35 PM By: Deon Pilling RN, BSN Entered By: Deon Pilling on 12/23/2022 14:56:00 Toma Deiters D ([id number]) [id number] Page 2 of 8 -------------------------------------------------------------------------------- Lower Extremity Assessment Details Patient Name: Date of Service: Deborah RT, [name] 12/23/2022 2:30 PM Medical Record Number: [id number] Patient Account Number: [id number] Date of Birth/Sex: Treating RN: 13-Jan-1936 (87 y.o. F) Primary Care Tyjae Shvartsman: Jerene Bears Other Clinician: Referring Nicky Milhouse: Treating Kieron Kantner/Extender: Milas Kocher, Dhruv Weeks in Treatment: 4 Edema Assessment Assessed: [Left: No] [Right: No] [Left: Edema] [Right: :] Calf Left: Right: Point of Measurement: 28 cm From Medial Instep 32 cm Ankle Left: Right: Point of Measurement: 11 cm From Medial Instep 26 cm Electronic Signature(s) Signed: 12/23/2022 3:54:35 PM By: Deon Pilling RN, BSN Entered By: Deon Pilling on 12/23/2022 14:44:01 -------------------------------------------------------------------------------- Multi Wound Chart Details Patient Name: Date of Service: Deborah RT, [name] 12/23/2022 2:30 PM Medical Record Number: [id number] Patient Account Number: [id number] Date of Birth/Sex: Treating RN: 09/14/1936 (87 y.o. F) Primary Care Jovita Persing: Jerene Bears Other Clinician: Referring Kristine Chahal: Treating Luria Rosario/Extender: Milas Kocher, Dhruv Weeks in Treatment: 4 Vital Signs Height(in): 62 Capillary Blood Glucose(mg/dl): 151 Weight(lbs): 161 Pulse(bpm): 87 Body Mass Index(BMI): 29.4 Blood Pressure(mmHg): 194/89 Temperature(F): 98.2 Respiratory Rate(breaths/min): 20 [1:Photos:]  [N/A:N/A] Right Lower Leg N/A N/A Wound Location: Skin Tear/Laceration N/A N/A Wounding Event: Diabetic Wound/Ulcer of the Lower N/A N/A Primary Etiology: Extremity Type II Diabetes, Osteoarthritis N/A N/A Comorbid History: 11/19/2022 N/A N/A Date Acquired: Deborah, Gonzalez ([id number]) [id number] Page 3 of 8 4 N/A N/A Weeks of Treatment: Open N/A N/A Wound Status: No N/A N/A Wound Recurrence: 2.5x0.5x0.2 N/A N/A Measurements L x W x D (cm) 0.982 N/A N/A A (cm) : rea 0.196 N/A N/A Volume (cm) : 68.70% N/A N/A % Reduction in A rea: 68.80% N/A  N/A % Reduction in Volume: Grade 2 N/A N/A Classification: Medium N/A N/A Exudate A mount: Serosanguineous N/A N/A Exudate Type: red, brown N/A N/A Exudate Color: Distinct, outline attached N/A N/A Wound Margin: Large (67-100%) N/A N/A Granulation A mount: Small (1-33%) N/A N/A Necrotic A mount: Fat Layer (Subcutaneous Tissue): Yes N/A N/A Exposed Structures: Fascia: No Tendon: No Muscle: No Joint: No Bone: No Medium (34-66%) N/A N/A Epithelialization: Debridement - Excisional N/A N/A Debridement: Pre-procedure Verification/Time Out 14:40 N/A N/A Taken: Lidocaine 4% Topical Solution N/A N/A Pain Control: Subcutaneous, Slough N/A N/A Tissue Debrided: Skin/Subcutaneous Tissue N/A N/A Level: 0.75 N/A N/A Debridement A (sq cm): rea Curette N/A N/A Instrument: Minimum N/A N/A Bleeding: Pressure N/A N/A Hemostasis A chieved: 1 N/A N/A Procedural Pain: 2 N/A N/A Post Procedural Pain: Procedure was tolerated well N/A N/A Debridement Treatment Response: 2.5x0.5x0.2 N/A N/A Post Debridement Measurements L x W x D (cm) 0.196 N/A N/A Post Debridement Volume: (cm) Excoriation: No N/A N/A Periwound Skin Texture: Induration: No Callus: No Crepitus: No Rash: No Scarring: No Maceration: No N/A N/A Periwound Skin Moisture: Dry/Scaly: No Atrophie Blanche: No N/A N/A Periwound  Skin Color: Cyanosis: No Ecchymosis: No Erythema: No Hemosiderin Staining: No Mottled: No Pallor: No Rubor: No Debridement N/A N/A Procedures Performed: Treatment Notes Wound #1 (Lower Leg) Wound Laterality: Right Cleanser Soap and Water Discharge Instruction: May shower and wash wound with dial antibacterial soap and water prior to dressing change. Peri-Wound Care Sween Lotion (Moisturizing lotion) Discharge Instruction: Apply moisturizing lotion as directed Topical Primary Dressing Hydrofera Blue Ready Transfer Foam, 2.5x2.5 (in/in) Discharge Instruction: Apply directly to wound bed as directed Secondary Dressing ABD Pad, 5x9 Discharge Instruction: Apply over primary dressing as directed. Woven Gauze Sponge, Non-Sterile 4x4 in Discharge Instruction: Apply over primary dressing as directed. Secured With Elastic Bandage 4 inch (ACE bandage) ELLAROSE, BRANDI ([id number]) [id number] Page 4 of 8 Discharge Instruction: Secure with ACE bandage as directed. Compression Wrap Kerlix Roll 4.5x3.1 (in/yd) Discharge Instruction: Apply Kerlix and Coban compression as directed. Compression Stockings Add-Ons Electronic Signature(s) Signed: 12/23/2022 3:07:44 PM By: Fredirick Maudlin MD FACS Entered By: Fredirick Maudlin on 12/23/2022 15:07:43 -------------------------------------------------------------------------------- Multi-Disciplinary Care Plan Details Patient Name: Date of Service: Deborah RT, [name] 12/23/2022 2:30 PM Medical Record Number: [id number] Patient Account Number: [id number] Date of Birth/Sex: Treating RN: 10/29/1936 (87 y.o. Debby Bud Primary Care Dannis Deroche: Jerene Bears Other Clinician: Referring Alisse Tuite: Treating Elajah Kunsman/Extender: Milas Kocher, Dhruv Weeks in Treatment: 4 Active Inactive Wound/Skin Impairment Nursing Diagnoses: Impaired tissue integrity Knowledge deficit related to ulceration/compromised skin  integrity Goals: Patient/caregiver will verbalize understanding of skin care regimen Date Initiated: 11/19/2022 Target Resolution Date: 12/24/2022 Goal Status: Active Ulcer/skin breakdown will have a volume reduction of 30% by week 4 Date Initiated: 11/19/2022 Target Resolution Date: 12/31/2022 Goal Status: Active Interventions: Assess patient/caregiver ability to obtain necessary supplies Assess patient/caregiver ability to perform ulcer/skin care regimen upon admission and as needed Assess ulceration(s) every visit Provide education on ulcer and skin care Notes: Electronic Signature(s) Signed: 12/23/2022 3:54:35 PM By: Deon Pilling RN, BSN Entered By: Deon Pilling on 12/23/2022 14:49:45 -------------------------------------------------------------------------------- Pain Assessment Details Patient Name: Date of Service: Deborah RT, [name] 12/23/2022 2:30 PM Medical Record Number: [id number] Patient Account Number: [id number] ARTHURINE, OLEARY ([id number]) [id number] Page 5 of 8 Date of Birth/Sex: Treating RN: 07-11-1936 (87 y.o. Debby Bud Primary Care Breslin Hemann: Other Clinician: Jerene Bears Referring Janssen Zee: Treating Treyveon Mochizuki/Extender: Milas Kocher, Dhruv Weeks in Treatment:  4 Active Problems Location of Pain Severity and Description of Pain Patient Has Paino No Site Locations Rate the pain. Current Pain Level: 0 Pain Management and Medication Current Pain Management: Medication: No Cold Application: No Rest: No Massage: No Activity: No T.E.N.S.: No Heat Application: No Leg drop or elevation: No Is the Current Pain Management Adequate: Adequate How does your wound impact your activities of daily livingo Sleep: No Bathing: No Appetite: No Relationship With Others: No Bladder Continence: No Emotions: No Bowel Continence: No Work: No Toileting: No Drive: No Dressing: No Hobbies: No Engineer, maintenance) Signed:  12/23/2022 3:54:35 PM By: Deon Pilling RN, BSN Entered By: Deon Pilling on 12/23/2022 14:46:11 -------------------------------------------------------------------------------- Patient/Caregiver Education Details Patient Name: Date of Service: Deborah RT, [name] 1/9/2024andnbsp2:30 PM Medical Record Number: [id number] Patient Account Number: [id number] Date of Birth/Gender: Treating RN: [birth year] (87 y.o. Debby Bud Primary Care Physician: Jerene Bears Other Clinician: Referring Physician: Treating Physician/Extender: Kathe Becton Weeks in Treatment: 4 Education Assessment Education Provided To: Patient Education Topics Provided Wound Debridement: Handouts: Wound Debridement ROXANNA, MCEVER ([id number]) [id number] Page 6 of 8 Methods: Explain/Verbal Responses: Reinforcements needed Electronic Signature(s) Signed: 12/23/2022 3:54:35 PM By: Deon Pilling RN, BSN Entered By: Deon Pilling on 12/23/2022 14:50:08 -------------------------------------------------------------------------------- Wound Assessment Details Patient Name: Date of Service: Deborah RT, [name] 12/23/2022 2:30 PM Medical Record Number: [id number] Patient Account Number: [id number] Date of Birth/Sex: Treating RN: 12-Sep-1936 (87 y.o. F) Primary Care Hawthorne Day: Jerene Bears Other Clinician: Referring Madi Bonfiglio: Treating Amora Sheehy/Extender: Milas Kocher, Dhruv Weeks in Treatment: 4 Wound Status Wound Number: 1 Primary Etiology: Diabetic Wound/Ulcer of the Lower Extremity Wound Location: Right Lower Leg Wound Status: Open Wounding Event: Skin Tear/Laceration Comorbid History: Type II Diabetes, Osteoarthritis Date Acquired: 11/19/2022 Weeks Of Treatment: 4 Clustered Wound: No Photos Wound Measurements Length: (cm) 2.5 Width: (cm) 0.5 Depth: (cm) 0.2 Area: (cm) 0.982 Volume: (cm) 0.196 % Reduction in Area: 68.7% % Reduction in Volume:  68.8% Epithelialization: Medium (34-66%) Tunneling: No Undermining: No Wound Description Classification: Grade 2 Wound Margin: Distinct, outline attached Exudate Amount: Medium Exudate Type: Serosanguineous Exudate Color: red, brown Foul Odor After Cleansing: No Slough/Fibrino Yes Wound Bed Granulation Amount: Large (67-100%) Exposed Structure Necrotic Amount: Small (1-33%) Fascia Exposed: No Necrotic Quality: Adherent Slough Fat Layer (Subcutaneous Tissue) Exposed: Yes Tendon Exposed: No Muscle Exposed: No Joint Exposed: No Bone Exposed: No Periwound Skin Texture Texture Color No Abnormalities Noted: No No Abnormalities Noted: No RHINA, KRAMME ([id number]) [id number] Page 7 of 8 Callus: No Atrophie Blanche: No Crepitus: No Cyanosis: No Excoriation: No Ecchymosis: No Induration: No Erythema: No Rash: No Hemosiderin Staining: No Scarring: No Mottled: No Pallor: No Moisture Rubor: No No Abnormalities Noted: No Dry / Scaly: No Maceration: No Treatment Notes Wound #1 (Lower Leg) Wound Laterality: Right Cleanser Soap and Water Discharge Instruction: May shower and wash wound with dial antibacterial soap and water prior to dressing change. Peri-Wound Care Sween Lotion (Moisturizing lotion) Discharge Instruction: Apply moisturizing lotion as directed Topical Primary Dressing Hydrofera Blue Ready Transfer Foam, 2.5x2.5 (in/in) Discharge Instruction: Apply directly to wound bed as directed Secondary Dressing ABD Pad, 5x9 Discharge Instruction: Apply over primary dressing as directed. Woven Gauze Sponge, Non-Sterile 4x4 in Discharge Instruction: Apply over primary dressing as directed. Secured With Elastic Bandage 4 inch (ACE bandage) Discharge Instruction: Secure with ACE bandage as directed. Compression Wrap Kerlix Roll 4.5x3.1 (in/yd) Discharge Instruction: Apply Kerlix and Coban compression as directed. Compression  Stockings Add-Ons Electronic  Signature(s) Signed: 12/23/2022 3:54:35 PM By: Deon Pilling RN, BSN Entered By: Deon Pilling on 12/23/2022 14:44:53 -------------------------------------------------------------------------------- Vitals Details Patient Name: Date of Service: Deborah RT, [name] 12/23/2022 2:30 PM Medical Record Number: [id number] Patient Account Number: [id number] Date of Birth/Sex: Treating RN: [birth year] (87 y.o. Debby Bud Primary Care Soriya Worster: Jerene Bears Other Clinician: Referring Nowell Sites: Treating Latrease Kunde/Extender: Milas Kocher, Dhruv Weeks in Treatment: 4 Vital Signs Time Taken: 14:45 Temperature (F): 98.2 Height (in): 62 Pulse (bpm): 87 Weight (lbs): 161 Respiratory Rate (breaths/min): 20 Body Mass Index (BMI): 29.4 Blood Pressure (mmHg): 194/89 Capillary Blood Glucose (mg/dl): 151 TARALEE, MARCUS D ([id number]) [id number] Page 8 of 8 Reference Range: 80 - 120 mg / dl Electronic Signature(s) Signed: 12/23/2022 3:54:35 PM By: Deon Pilling RN, BSN Entered By: Deon Pilling on 12/23/2022 14:46:02

## 2022-12-23 NOTE — Progress Notes (Signed)
Deborah, Gonzalez ([id number]) [id number] Page 1 of 7 Visit Report for 12/23/2022 Chief Complaint Document Details Patient Name: Date of Service: Deborah RTSilvestre Moment. 12/23/2022 2:30 PM Medical Record Number: [id number] Patient Account Number: [id number] Date of Birth/Sex: Treating RN: Jun 23, 1936 (87 y.o. F) Primary Care Provider: Jerene Bears Other Clinician: Referring Provider: Treating Provider/Extender: Kathe Becton Weeks in Treatment: 4 Information Obtained from: Patient Chief Complaint Patient seen for complaints of Non-Healing Wound. Electronic Signature(s) Signed: 12/23/2022 3:09:07 PM By: Fredirick Maudlin MD FACS Entered By: Fredirick Maudlin on 12/23/2022 15:09:07 -------------------------------------------------------------------------------- Debridement Details Patient Name: Date of Service: [name], [name]. 12/23/2022 2:30 PM Medical Record Number: [id number] Patient Account Number: [id number] Date of Birth/Sex: Treating RN: 12/10/1936 (87 y.o. F) Primary Care Provider: Jerene Bears Other Clinician: Referring Provider: Treating Provider/Extender: Kathe Becton Weeks in Treatment: 4 Debridement Performed for Assessment: Wound #1 Right Lower Leg Performed By: Physician Fredirick Maudlin, MD Debridement Type: Debridement Severity of Tissue Pre Debridement: Fat layer exposed Level of Consciousness (Pre-procedure): Awake and Alert Pre-procedure Verification/Time Out Yes - 14:40 Taken: Start Time: 14:41 Pain Control: Lidocaine 4% T opical Solution T Area Debrided (L x W): otal 2.5 (cm) x 0.3 (cm) = 0.75 (cm) Tissue and other material debrided: Non-Viable, Slough, Subcutaneous, Slough Level: Skin/Subcutaneous Tissue Debridement Description: Excisional Instrument: Curette Bleeding: Minimum Hemostasis Achieved: Pressure End Time: 14:50 Procedural Pain: 1 Post Procedural Pain: 2 Response to Treatment: Procedure was  tolerated well Level of Consciousness (Post- Awake and Alert procedure): Post Debridement Measurements of Total Wound Length: (cm) 2.5 Width: (cm) 0.5 Depth: (cm) 0.2 Volume: (cm) 0.196 Character of Wound/Ulcer Post Debridement: Improved Severity of Tissue Post Debridement: Fat layer exposed [name] ([id number]) [id number] Page 2 of 7 Post Procedure Diagnosis Same as Pre-procedure Electronic Signature(s) Signed: 12/23/2022 3:22:01 PM By: Fredirick Maudlin MD FACS Entered By: Fredirick Maudlin on 12/23/2022 15:22:01 -------------------------------------------------------------------------------- HPI Details Patient Name: Date of Service: [name]. 12/23/2022 2:30 PM Medical Record Number: [id number] Patient Account Number: [id number] Date of Birth/Sex: Treating RN: [birth year] (87 y.o. F) Primary Care Provider: Jerene Bears Other Clinician: Referring Provider: Treating Provider/Extender: Milas Kocher, Dhruv Weeks in Treatment: 4 History of Present Illness HPI Description: ADMISSION 11/19/2022 This is an 87 year old woman who was apparently diagnosed with cellulitis in October and placed on doxycycline. She was on doxycycline for over Deborah month. She developed an ulcer on her right ankle. It apparently expanded fairly dramatically over the course of about Deborah week and as Deborah result, she sought treatment at Deborah local emergency room. She was seen on November 16, 2022. At that point, the doxycycline was discontinued and clindamycin prescribed. She was referred to the wound care center for further evaluation and management. She says that since starting the clindamycin, the erythema in her leg has resolved almost completely. She has Deborah triangular wound on her right lower leg, just medial to the anterior tibial surface. There is Deborah thick layer of eschar on the surface. There is no malodor or purulent drainage. Periwound skin is intact. 12/13; this is Deborah  patient with Deborah wound on the right lower extremity. We have been using Iodoflex under 3 layer compression. She is completing her clindamycin 12/11/2022: The patient has been experiencing more pain in her leg, lateral to the wound and up towards her knee; she states the wound itself does not hurt. Secondary to this discomfort, she removed her compression wrap. The wound is Deborah  little bit smaller today and has just Deborah bit of slough and eschar on the surface. 12/23/2022: The wound is smaller and cleaner today and edema control is good. Small amount of slough on the wound surface. Electronic Signature(s) Signed: 12/23/2022 3:09:44 PM By: Fredirick Maudlin MD FACS Entered By: Fredirick Maudlin on 12/23/2022 15:09:44 -------------------------------------------------------------------------------- Physical Exam Details Patient Name: Date of Service: [name], [name]. 12/23/2022 2:30 PM Medical Record Number: [id number] Patient Account Number: [id number] Date of Birth/Sex: Treating RN: 26-Dec-1935 (87 y.o. F) Primary Care Provider: Jerene Bears Other Clinician: Referring Provider: Treating Provider/Extender: Milas Kocher, Dhruv Weeks in Treatment: 4 Constitutional Hypertensive, asymptomatic. . . . no acute distress. Respiratory Normal work of breathing on room air. Notes TONEY, LIZAOLA ([id number]) [id number] Page 3 of 7 12/23/2022: The wound is smaller and cleaner today and edema control is good. Small amount of slough on the wound surface. Electronic Signature(s) Signed: 12/23/2022 3:21:05 PM By: Fredirick Maudlin MD FACS Previous Signature: 12/23/2022 3:10:48 PM Version By: Fredirick Maudlin MD FACS Entered By: Fredirick Maudlin on 12/23/2022 15:21:04 -------------------------------------------------------------------------------- Physician Orders Details Patient Name: Date of Service: [name], [name]. 12/23/2022 2:30 PM Medical Record Number: [id number] Patient  Account Number: [id number] Date of Birth/Sex: Treating RN: [birth year] (87 y.o. [name] Primary Care Provider: Jerene Bears Other Clinician: Referring Provider: Treating Provider/Extender: Kathe Becton Weeks in Treatment: 4 Verbal / Phone Orders: No Diagnosis Coding ICD-10 Coding Code Description V42.595 Non-pressure chronic ulcer of right ankle with fat layer exposed Follow-up Appointments ppointment in 1 week. - Dr. Celine Ahr - Room 2 12/30/2022 245pm Return Deborah ppointment in 2 weeks. - Dr. Celine Ahr Room 2 245 pm 01/06/2023 Return Deborah Anesthetic Wound #1 Right Lower Leg (In clinic) Topical Lidocaine 4% applied to wound bed Edema Control - Lymphedema / SCD / Other Right Lower Extremity Avoid standing for long periods of time. Wound Treatment Wound #1 - Lower Leg Wound Laterality: Right Cleanser: Soap and Water 1 x Per Week/30 Days Discharge Instructions: May shower and wash wound with dial antibacterial soap and water prior to dressing change. Peri-Wound Care: Sween Lotion (Moisturizing lotion) 1 x Per Week/30 Days Discharge Instructions: Apply moisturizing lotion as directed Prim Dressing: Hydrofera Blue Ready Transfer Foam, 2.5x2.5 (in/in) 1 x Per Week/30 Days ary Discharge Instructions: Apply directly to wound bed as directed Secondary Dressing: ABD Pad, 5x9 1 x Per Week/30 Days Discharge Instructions: Apply over primary dressing as directed. Secondary Dressing: Woven Gauze Sponge, Non-Sterile 4x4 in 1 x Per Week/30 Days Discharge Instructions: Apply over primary dressing as directed. Secured With: Elastic Bandage 4 inch (ACE bandage) 1 x Per Week/30 Days Discharge Instructions: Secure with ACE bandage as directed. Compression Wrap: Kerlix Roll 4.5x3.1 (in/yd) 1 x Per Week/30 Days Discharge Instructions: Apply Kerlix and Coban compression as directed. Electronic Signature(s) Signed: 12/23/2022 3:52:46 PM By: Fredirick Maudlin MD FACS Entered By: Fredirick Maudlin  on 12/23/2022 15:21:13 [name] ([id number]) [id number] Page 4 of 7 -------------------------------------------------------------------------------- Problem List Details Patient Name: Date of Service: [name], [name] 12/23/2022 2:30 PM Medical Record Number: [id number] Patient Account Number: [id number] Date of Birth/Sex: Treating RN: [birth year] (87 y.o. [name] Primary Care Provider: Jerene Bears Other Clinician: Referring Provider: Treating Provider/Extender: Kathe Becton Weeks in Treatment: 4 Active Problems ICD-10 Encounter Code Description Active Date MDM Diagnosis L97.312 Non-pressure chronic ulcer of right ankle with fat layer exposed 11/19/2022 No Yes Inactive Problems Resolved Problems Electronic Signature(s)  Signed: 12/23/2022 3:07:36 PM By: Fredirick Maudlin MD FACS Entered By: Fredirick Maudlin on 12/23/2022 15:07:36 -------------------------------------------------------------------------------- Progress Note Details Patient Name: Date of Service: [name], [name]. 12/23/2022 2:30 PM Medical Record Number: [id number] Patient Account Number: [id number] Date of Birth/Sex: Treating RN: 1936-06-30 (87 y.o. F) Primary Care Provider: Jerene Bears Other Clinician: Referring Provider: Treating Provider/Extender: Kathe Becton Weeks in Treatment: 4 Subjective Chief Complaint Information obtained from Patient Patient seen for complaints of Non-Healing Wound. History of Present Illness (HPI) ADMISSION 11/19/2022 This is an 87 year old woman who was apparently diagnosed with cellulitis in October and placed on doxycycline. She was on doxycycline for over Deborah month. She developed an ulcer on her right ankle. It apparently expanded fairly dramatically over the course of about Deborah week and as Deborah result, she sought treatment at Deborah local emergency room. She was seen on November 16, 2022. At that point, the  doxycycline was discontinued and clindamycin prescribed. She was referred to the wound care center for further evaluation and management. She says that since starting the clindamycin, the erythema in her leg has resolved almost completely. She has Deborah triangular wound on her right lower leg, just medial to the anterior tibial surface. There is Deborah thick layer of eschar on the surface. There is no malodor or purulent drainage. Periwound skin is intact. 12/13; this is Deborah patient with Deborah wound on the right lower extremity. We have been using Iodoflex under 3 layer compression. She is completing her clindamycin 12/11/2022: The patient has been experiencing more pain in her leg, lateral to the wound and up towards her knee; she states the wound itself does not hurt. Secondary to this discomfort, she removed her compression wrap. The wound is Deborah little bit smaller today and has just Deborah bit of slough and eschar on the surface. 12/23/2022: The wound is smaller and cleaner today and edema control is good. Small amount of slough on the wound surface. KOA, PALLA ([id number]) [id number] Page 5 of 7 Patient History Information obtained from Patient. Family History Cancer - Maternal Grandparents,Father, Diabetes - Paternal Grandparents,Father, Heart Disease - Mother, Hypertension - Mother, Stroke - Paternal Grandparents, Thyroid Problems - Mother,Child, No family history of Seizures. Social History Never smoker, Marital Status - Divorced, Alcohol Use - Never, Drug Use - No History, Caffeine Use - Daily. Medical History Endocrine Patient has history of Type II Diabetes Musculoskeletal Patient has history of Osteoarthritis Objective Constitutional Hypertensive, asymptomatic. no acute distress. Vitals Time Taken: 2:45 PM, Height: 62 in, Weight: 161 lbs, BMI: 29.4, Temperature: 98.2 F, Pulse: 87 bpm, Respiratory Rate: 20 breaths/min, Blood Pressure: 194/89 mmHg, Capillary Blood  Glucose: 151 mg/dl. Respiratory Normal work of breathing on room air. General Notes: 12/23/2022: The wound is smaller and cleaner today and edema control is good. Small amount of slough on the wound surface. Integumentary (Hair, Skin) Wound #1 status is Open. Original cause of wound was Skin T ear/Laceration. The date acquired was: 11/19/2022. The wound has been in treatment 4 weeks. The wound is located on the Right Lower Leg. The wound measures 2.5cm length x 0.5cm width x 0.2cm depth; 0.982cm^2 area and 0.196cm^3 volume. There is Fat Layer (Subcutaneous Tissue) exposed. There is no tunneling or undermining noted. There is Deborah medium amount of serosanguineous drainage noted. The wound margin is distinct with the outline attached to the wound base. There is large (67-100%) granulation within the wound bed. There is Deborah small (1-33%) amount of necrotic tissue within  the wound bed including Adherent Slough. The periwound skin appearance did not exhibit: Callus, Crepitus, Excoriation, Induration, Rash, Scarring, Dry/Scaly, Maceration, Atrophie Blanche, Cyanosis, Ecchymosis, Hemosiderin Staining, Mottled, Pallor, Rubor, Erythema. Assessment Active Problems ICD-10 Non-pressure chronic ulcer of right ankle with fat layer exposed Procedures Wound #1 Pre-procedure diagnosis of Wound #1 is Deborah Diabetic Wound/Ulcer of the Lower Extremity located on the Right Lower Leg .Severity of Tissue Pre Debridement is: Fat layer exposed. There was Deborah Excisional Skin/Subcutaneous Tissue Debridement with Deborah total area of 0.75 sq cm performed by Fredirick Maudlin, MD. With the following instrument(s): Curette to remove Non-Viable tissue/material. Material removed includes Subcutaneous Tissue and Slough and after achieving pain control using Lidocaine 4% T opical Solution. Deborah time out was conducted at 14:40, prior to the start of the procedure. Deborah Minimum amount of bleeding was controlled with Pressure. The procedure was tolerated  well with Deborah pain level of 1 throughout and Deborah pain level of 2 following the procedure. Post Debridement Measurements: 2.5cm length x 0.5cm width x 0.2cm depth; 0.196cm^3 volume. Character of Wound/Ulcer Post Debridement is improved. Severity of Tissue Post Debridement is: Fat layer exposed. Post procedure Diagnosis Wound #1: Same as Pre-Procedure Plan Follow-up Appointments: Return Appointment in 1 week. - Dr. Celine Ahr - Room 2 12/30/2022 245pm Return Appointment in 2 weeks. - Dr. Celine Ahr Room 2 245 pm 01/06/2023 [name] ([id number]) [id number] Page 6 of 7 Anesthetic: Wound #1 Right Lower Leg: (In clinic) Topical Lidocaine 4% applied to wound bed Edema Control - Lymphedema / SCD / Other: Avoid standing for long periods of time. WOUND #1: - Lower Leg Wound Laterality: Right Cleanser: Soap and Water 1 x Per Week/30 Days Discharge Instructions: May shower and wash wound with dial antibacterial soap and water prior to dressing change. Peri-Wound Care: Sween Lotion (Moisturizing lotion) 1 x Per Week/30 Days Discharge Instructions: Apply moisturizing lotion as directed Prim Dressing: Hydrofera Blue Ready Transfer Foam, 2.5x2.5 (in/in) 1 x Per Week/30 Days ary Discharge Instructions: Apply directly to wound bed as directed Secondary Dressing: ABD Pad, 5x9 1 x Per Week/30 Days Discharge Instructions: Apply over primary dressing as directed. Secondary Dressing: Woven Gauze Sponge, Non-Sterile 4x4 in 1 x Per Week/30 Days Discharge Instructions: Apply over primary dressing as directed. Secured With: Elastic Bandage 4 inch (ACE bandage) 1 x Per Week/30 Days Discharge Instructions: Secure with ACE bandage as directed. Com pression Wrap: Kerlix Roll 4.5x3.1 (in/yd) 1 x Per Week/30 Days Discharge Instructions: Apply Kerlix and Coban compression as directed. 12/23/2022: The wound is smaller and cleaner today and edema control is good. Small amount of slough on the wound  surface. I used Deborah curette to debride slough and nonviable subcutaneous tissue from the wound. I am going to change her contact layer to [facility]. Continue Kerlix and ace wrap compression. Follow-up in 1 week. Electronic Signature(s) Signed: 12/23/2022 3:22:35 PM By: Fredirick Maudlin MD FACS Entered By: Fredirick Maudlin on 12/23/2022 15:22:35 -------------------------------------------------------------------------------- HxROS Details Patient Name: Date of Service: [name], [name]. 12/23/2022 2:30 PM Medical Record Number: [id number] Patient Account Number: [id number] Date of Birth/Sex: Treating RN: August 22, 1936 (87 y.o. F) Primary Care Provider: Jerene Bears Other Clinician: Referring Provider: Treating Provider/Extender: Kathe Becton Weeks in Treatment: 4 Information Obtained From Patient Endocrine Medical History: Positive for: Type II Diabetes Time with diabetes: 30 yrs ago Blood sugar tested every day: Yes Tested : Musculoskeletal Medical History: Positive for: Osteoarthritis Immunizations Pneumococcal Vaccine: Received Pneumococcal Vaccination: No Implantable Devices None  Family and Social History Cancer: Yes - Maternal Grandparents,Father; Diabetes: Yes - Paternal Grandparents,Father; Heart Disease: Yes - Mother; Hypertension: Yes - Mother; Seizures: No; Stroke: Yes - Paternal Grandparents; Thyroid Problems: Yes - Mother,Child; Never smoker; Marital Status - Divorced; Alcohol Use: Never; Drug Use: No History; Caffeine Use: Daily; Financial Concerns: No; Food, Clothing or Shelter Needs: No; Support System Lacking: No; Transportation Concerns: No RYLIN, SAEZ ([id number]) [id number] Page 7 of 7 Electronic Signature(s) Signed: 12/23/2022 3:52:46 PM By: Fredirick Maudlin MD FACS Entered By: Fredirick Maudlin on 12/23/2022 15:09:49 -------------------------------------------------------------------------------- SuperBill  Details Patient Name: Date of Service: [name], [name]. 12/23/2022 Medical Record Number: [id number] Patient Account Number: [id number] Date of Birth/Sex: Treating RN: [birth year] (87 y.o. [name] Primary Care Provider: Jerene Bears Other Clinician: Referring Provider: Treating Provider/Extender: Kathe Becton Weeks in Treatment: 4 Diagnosis Coding ICD-10 Codes Code Description [phone] Non-pressure chronic ulcer of right ankle with fat layer exposed Facility Procedures : 3 CPT4 Code: [id number] Description: Hope TISSUE 20 SQ CM/< ICD-10 Diagnosis Description E32.122 Non-pressure chronic ulcer of right ankle with fat layer exposed Modifier: Quantity: 1 Physician Procedures : CPT4 Code Description Modifier [id number] - WC PHYS LEVEL 3 - EST PT 25 ICD-10 Diagnosis Description Q91.694 Non-pressure chronic ulcer of right ankle with fat layer exposed Quantity: 1 : [id number] - WC PHYS SUBQ TISS 20 SQ CM ICD-10 Diagnosis Description J17.915 Non-pressure chronic ulcer of right ankle with fat layer exposed Quantity: 1 Electronic Signature(s) Signed: 12/23/2022 3:22:46 PM By: Fredirick Maudlin MD FACS Entered By: Fredirick Maudlin on 12/23/2022 15:22:46

## 2022-12-30 ENCOUNTER — Encounter (HOSPITAL_BASED_OUTPATIENT_CLINIC_OR_DEPARTMENT_OTHER): Payer: Medicare Other | Admitting: General Surgery

## 2022-12-30 DIAGNOSIS — L97812 Non-pressure chronic ulcer of other part of right lower leg with fat layer exposed: Secondary | ICD-10-CM | POA: Diagnosis not present

## 2022-12-30 DIAGNOSIS — Z833 Family history of diabetes mellitus: Secondary | ICD-10-CM | POA: Diagnosis not present

## 2022-12-30 DIAGNOSIS — E11621 Type 2 diabetes mellitus with foot ulcer: Secondary | ICD-10-CM | POA: Diagnosis not present

## 2022-12-30 DIAGNOSIS — E11622 Type 2 diabetes mellitus with other skin ulcer: Secondary | ICD-10-CM | POA: Diagnosis not present

## 2022-12-30 DIAGNOSIS — L97312 Non-pressure chronic ulcer of right ankle with fat layer exposed: Secondary | ICD-10-CM | POA: Diagnosis not present

## 2022-12-30 DIAGNOSIS — I890 Lymphedema, not elsewhere classified: Secondary | ICD-10-CM | POA: Diagnosis not present

## 2022-12-30 NOTE — Progress Notes (Signed)
CHENEL, WERNLI ([id number]) [id number] Page 1 of 8 Visit Report for 12/30/2022 Arrival Information Details Patient Name: Date of Service: DEHA RTSilvestre Moment 12/30/2022 2:45 PM Medical Record Number: [id number] Patient Account Number: [id number] Date of Birth/Sex: Treating RN: 1936-02-16 (87 y.o. Donalda Ewings Primary Care Shaylon Gillean: Jerene Bears Other Clinician: Referring Viki Carrera: Treating Darien Mignogna/Extender: Kathe Becton Weeks in Treatment: 5 Visit Information History Since Last Visit Added or deleted any medications: No Patient Arrived: Ambulatory Any new allergies or adverse reactions: No Arrival Time: 15:09 Had a fall or experienced change in No Accompanied By: self activities of daily living that may affect Transfer Assistance: None risk of falls: Patient Identification Verified: Yes Signs or symptoms of abuse/neglect since last visito No Secondary Verification Process Completed: Yes Hospitalized since last visit: No Implantable device outside of the clinic excluding No cellular tissue based products placed in the center since last visit: Has Dressing in Place as Prescribed: Yes Has Compression in Place as Prescribed: Yes Pain Present Now: No Electronic Signature(s) Signed: 12/30/2022 3:57:27 PM By: Sharyn Creamer RN, BSN Entered By: Sharyn Creamer on 12/30/2022 15:09:48 -------------------------------------------------------------------------------- Encounter Discharge Information Details Patient Name: Date of Service: [name], [name] 12/30/2022 2:45 PM Medical Record Number: [id number] Patient Account Number: [id number] Date of Birth/Sex: Treating RN: [birth year] (87 y.o. Donalda Ewings Primary Care Danile Trier: Jerene Bears Other Clinician: Referring Daymian Lill: Treating Myha Arizpe/Extender: Kathe Becton Weeks in Treatment: 5 Encounter Discharge Information Items Post Procedure Vitals Discharge Condition:  Stable Temperature (F): 97.7 Ambulatory Status: Ambulatory Pulse (bpm): 70 Discharge Destination: Home Respiratory Rate (breaths/min): 18 Transportation: Private Auto Blood Pressure (mmHg): 179/88 Accompanied By: self Schedule Follow-up Appointment: Yes Clinical Summary of Care: Patient Declined Electronic Signature(s) Signed: 12/30/2022 3:57:27 PM By: Sharyn Creamer RN, BSN Entered By: Sharyn Creamer on 12/30/2022 15:25:26 [name] ([id number]) [id number] Page 2 of 8 -------------------------------------------------------------------------------- Lower Extremity Assessment Details Patient Name: Date of Service: [name], [name] 12/30/2022 2:45 PM Medical Record Number: [id number] Patient Account Number: [id number] Date of Birth/Sex: Treating RN: 1936-02-21 (87 y.o. Donalda Ewings Primary Care Benjie Ricketson: Jerene Bears Other Clinician: Referring Tyrick Dunagan: Treating Durelle Zepeda/Extender: Milas Kocher, Dhruv Weeks in Treatment: 5 Edema Assessment Assessed: [Left: No] [Right: Yes] [Left: Edema] [Right: :] Calf Left: Right: Point of Measurement: 28 cm From Medial Instep 33 cm Ankle Left: Right: Point of Measurement: 11 cm From Medial Instep 25 cm Vascular Assessment Pulses: Dorsalis Pedis Palpable: [Right:Yes] Electronic Signature(s) Signed: 12/30/2022 3:57:27 PM By: Sharyn Creamer RN, BSN Entered By: Sharyn Creamer on 12/30/2022 15:10:35 -------------------------------------------------------------------------------- Multi Wound Chart Details Patient Name: Date of Service: [name], [name] 12/30/2022 2:45 PM Medical Record Number: [id number] Patient Account Number: [id number] Date of Birth/Sex: Treating RN: [birth year] (87 y.o. F) Primary Care Florella Mcneese: Jerene Bears Other Clinician: Referring Isaiah Torok: Treating Nimah Uphoff/Extender: Milas Kocher, Dhruv Weeks in Treatment: 5 Vital Signs Height(in): 62 Pulse(bpm):  70 Weight(lbs): 161 Blood Pressure(mmHg): 179/88 Body Mass Index(BMI): 29.4 Temperature(F): 97.7 Respiratory Rate(breaths/min): 18 [1:Photos:] [N/A:N/A] Right Lower Leg N/A N/A Wound Location: Skin T ear/Laceration N/A N/A Wounding Event: Diabetic Wound/Ulcer of the Lower N/A N/A Primary Etiology: Extremity Type II Diabetes, Osteoarthritis N/A N/A Comorbid History: 11/19/2022 N/A N/A Date Acquired: 5 N/A N/A Weeks of Treatment: Open N/A N/A Wound Status: No N/A N/A Wound Recurrence: 2.2x0.5x0.1 N/A N/A Measurements L x W x D (cm) 0.864 N/A N/A A (cm) : rea 0.086 N/A N/A Volume (cm) : 72.50%  N/A N/A % Reduction in A rea: 86.30% N/A N/A % Reduction in Volume: Grade 2 N/A N/A Classification: Medium N/A N/A Exudate A mount: Serosanguineous N/A N/A Exudate Type: red, brown N/A N/A Exudate Color: Distinct, outline attached N/A N/A Wound Margin: Small (1-33%) N/A N/A Granulation A mount: Large (67-100%) N/A N/A Necrotic A mount: Eschar, Adherent Slough N/A N/A Necrotic Tissue: Fat Layer (Subcutaneous Tissue): Yes N/A N/A Exposed Structures: Fascia: No Tendon: No Muscle: No Joint: No Bone: No Medium (34-66%) N/A N/A Epithelialization: Debridement - Selective/Open Wound N/A N/A Debridement: Pre-procedure Verification/Time Out 15:10 N/A N/A Taken: Lidocaine 5% topical ointment N/A N/A Pain Control: Necrotic/Eschar, Slough N/A N/A Tissue Debrided: Non-Viable Tissue N/A N/A Level: 1.1 N/A N/A Debridement A (sq cm): rea Curette N/A N/A Instrument: Minimum N/A N/A Bleeding: Pressure N/A N/A Hemostasis A chieved: 0 N/A N/A Procedural Pain: 0 N/A N/A Post Procedural Pain: Procedure was tolerated well N/A N/A Debridement Treatment Response: 2.2x0.5x0.1 N/A N/A Post Debridement Measurements L x W x D (cm) 0.086 N/A N/A Post Debridement Volume: (cm) Excoriation: No N/A N/A Periwound Skin Texture: Induration: No Callus: No Crepitus:  No Rash: No Scarring: No Maceration: No N/A N/A Periwound Skin Moisture: Dry/Scaly: No Atrophie Blanche: No N/A N/A Periwound Skin Color: Cyanosis: No Ecchymosis: No Erythema: No Hemosiderin Staining: No Mottled: No Pallor: No Rubor: No Debridement N/A N/A Procedures Performed: Treatment Notes Wound #1 (Lower Leg) Wound Laterality: Right Cleanser Soap and Water Discharge Instruction: May shower and wash wound with dial antibacterial soap and water prior to dressing change. Peri-Wound Care Sween Lotion (Moisturizing lotion) Discharge Instruction: Apply moisturizing lotion as directed Topical Primary Dressing Hydrofera Blue Ready Transfer Foam, 2.5x2.5 (in/in) Discharge Instruction: Apply directly to wound bed as directed Secondary Dressing ABD Pad, 5x9 SUJATA, MAINES ([id number]) [phone].pdf Page 4 of 8 Discharge Instruction: Apply over primary dressing as directed. Woven Gauze Sponge, Non-Sterile 4x4 in Discharge Instruction: Apply over primary dressing as directed. Secured With Elastic Bandage 4 inch (ACE bandage) Discharge Instruction: Secure with ACE bandage as directed. Compression Wrap Kerlix Roll 4.5x3.1 (in/yd) Discharge Instruction: Apply Kerlix and Coban compression as directed. Compression Stockings Add-Ons Electronic Signature(s) Signed: 12/30/2022 3:41:54 PM By: Fredirick Maudlin MD FACS Entered By: Fredirick Maudlin on 12/30/2022 15:41:54 -------------------------------------------------------------------------------- Multi-Disciplinary Care Plan Details Patient Name: Date of Service: [name], [name] 12/30/2022 2:45 PM Medical Record Number: [id number] Patient Account Number: [id number] Date of Birth/Sex: Treating RN: 07/07/1936 (87 y.o. Donalda Ewings Primary Care Senovia Gauer: Jerene Bears Other Clinician: Referring Trason Shifflet: Treating Tameya Kuznia/Extender: Milas Kocher, Dhruv Weeks in Treatment: 5 Active  Inactive Wound/Skin Impairment Nursing Diagnoses: Impaired tissue integrity Knowledge deficit related to ulceration/compromised skin integrity Goals: Patient/caregiver will verbalize understanding of skin care regimen Date Initiated: 11/19/2022 Target Resolution Date: 12/24/2022 Goal Status: Active Ulcer/skin breakdown will have a volume reduction of 30% by week 4 Date Initiated: 11/19/2022 Target Resolution Date: 12/31/2022 Goal Status: Active Interventions: Assess patient/caregiver ability to obtain necessary supplies Assess patient/caregiver ability to perform ulcer/skin care regimen upon admission and as needed Assess ulceration(s) every visit Provide education on ulcer and skin care Notes: Electronic Signature(s) Signed: 12/30/2022 3:57:27 PM By: Sharyn Creamer RN, BSN Entered By: Sharyn Creamer on 12/30/2022 15:13:07 Toma Deiters D ([id number]) [id number] Page 5 of 8 -------------------------------------------------------------------------------- Pain Assessment Details Patient Name: Date of Service: [name], [name] 12/30/2022 2:45 PM Medical Record Number: [id number] Patient Account Number: [id number] Date of Birth/Sex: Treating RN: 1936-10-04 (87 y.o. Donalda Ewings Primary  Care Shunda Rabadi: Jerene Bears Other Clinician: Referring Saphia Vanderford: Treating Rasheed Welty/Extender: Milas Kocher, Dhruv Weeks in Treatment: 5 Active Problems Location of Pain Severity and Description of Pain Patient Has Paino No Site Locations Pain Management and Medication Current Pain Management: Electronic Signature(s) Signed: 12/30/2022 3:57:27 PM By: Sharyn Creamer RN, BSN Entered By: Sharyn Creamer on 12/30/2022 15:10:18 -------------------------------------------------------------------------------- Patient/Caregiver Education Details Patient Name: Date of Service: [name] 1/16/2024andnbsp2:45 PM Medical Record Number: [id number] Patient  Account Number: [id number] Date of Birth/Gender: Treating RN: [birth year] (87 y.o. Donalda Ewings Primary Care Physician: Jerene Bears Other Clinician: Referring Physician: Treating Physician/Extender: Kathe Becton Weeks in Treatment: 5 Education Assessment Education Provided To: Patient Education Topics Provided Wound/Skin Impairment: Methods: Explain/Verbal Responses: State content correctly TONNYA, GARBETT ([id number]) [id number] Page 6 of 8 Electronic Signature(s) Signed: 12/30/2022 3:57:27 PM By: Sharyn Creamer RN, BSN Entered By: Sharyn Creamer on 12/30/2022 15:13:29 -------------------------------------------------------------------------------- Wound Assessment Details Patient Name: Date of Service: [name], [name] 12/30/2022 2:45 PM Medical Record Number: [id number] Patient Account Number: [id number] Date of Birth/Sex: Treating RN: 19-Apr-1936 (87 y.o. Donalda Ewings Primary Care Terrill Wauters: Jerene Bears Other Clinician: Referring Marvyn Torrez: Treating Consetta Cosner/Extender: Milas Kocher, Dhruv Weeks in Treatment: 5 Wound Status Wound Number: 1 Primary Etiology: Diabetic Wound/Ulcer of the Lower Extremity Wound Location: Right Lower Leg Wound Status: Open Wounding Event: Skin Tear/Laceration Comorbid History: Type II Diabetes, Osteoarthritis Date Acquired: 11/19/2022 Weeks Of Treatment: 5 Clustered Wound: No Photos Wound Measurements Length: (cm) 2.2 Width: (cm) 0.5 Depth: (cm) 0.1 Area: (cm) 0.864 Volume: (cm) 0.086 % Reduction in Area: 72.5% % Reduction in Volume: 86.3% Epithelialization: Medium (34-66%) Tunneling: No Undermining: No Wound Description Classification: Grade 2 Wound Margin: Distinct, outline attached Exudate Amount: Medium Exudate Type: Serosanguineous Exudate Color: red, brown Foul Odor After Cleansing: No Slough/Fibrino Yes Wound Bed Granulation Amount: Small (1-33%) Exposed  Structure Necrotic Amount: Large (67-100%) Fascia Exposed: No Necrotic Quality: Eschar, Adherent Slough Fat Layer (Subcutaneous Tissue) Exposed: Yes Tendon Exposed: No Muscle Exposed: No Joint Exposed: No Bone Exposed: No Periwound Skin Texture Texture Color No Abnormalities Noted: No No Abnormalities Noted: No Callus: No Atrophie Blanche: No Crepitus: No Cyanosis: No Excoriation: No Ecchymosis: No TONAE, LIVOLSI ([id number]) [id number] Page 7 of 8 Induration: No Erythema: No Rash: No Hemosiderin Staining: No Scarring: No Mottled: No Pallor: No Moisture Rubor: No No Abnormalities Noted: No Dry / Scaly: No Maceration: No Treatment Notes Wound #1 (Lower Leg) Wound Laterality: Right Cleanser Soap and Water Discharge Instruction: May shower and wash wound with dial antibacterial soap and water prior to dressing change. Peri-Wound Care Sween Lotion (Moisturizing lotion) Discharge Instruction: Apply moisturizing lotion as directed Topical Primary Dressing Hydrofera Blue Ready Transfer Foam, 2.5x2.5 (in/in) Discharge Instruction: Apply directly to wound bed as directed Secondary Dressing ABD Pad, 5x9 Discharge Instruction: Apply over primary dressing as directed. Woven Gauze Sponge, Non-Sterile 4x4 in Discharge Instruction: Apply over primary dressing as directed. Secured With Elastic Bandage 4 inch (ACE bandage) Discharge Instruction: Secure with ACE bandage as directed. Compression Wrap Kerlix Roll 4.5x3.1 (in/yd) Discharge Instruction: Apply Kerlix and Coban compression as directed. Compression Stockings Add-Ons Electronic Signature(s) Signed: 12/30/2022 3:57:27 PM By: Sharyn Creamer RN, BSN Entered By: Sharyn Creamer on 12/30/2022 15:06:46 -------------------------------------------------------------------------------- [place] Details Patient Name: Date of Service: [name], [name] 12/30/2022 2:45 PM Medical Record Number:  [id number] Patient Account Number: [id number] Date of Birth/Sex: Treating RN: 10-02-1936 (87 y.o. Donalda Ewings Primary Care  Coalton Arch: Jerene Bears Other Clinician: Referring Malinda Mayden: Treating Elis Sauber/Extender: Milas Kocher, Dhruv Weeks in Treatment: 5 Vital Signs Time Taken: 15:09 Temperature (F): 97.7 Height (in): 62 Pulse (bpm): 70 Weight (lbs): 161 Respiratory Rate (breaths/min): 18 Body Mass Index (BMI): 29.4 Blood Pressure (mmHg): 179/88 Reference Range: 80 - 120 mg / dl Electronic Signature(s) ORLEAN, HOLTROP D ([id number]) [id number] Page 8 of 8 Signed: 12/30/2022 3:57:27 PM By: Sharyn Creamer RN, BSN Entered By: Sharyn Creamer on 12/30/2022 15:10:11

## 2022-12-30 NOTE — Progress Notes (Signed)
JANAVIA, ROTTMAN ([id number]) [id number] Page 1 of 7 Visit Report for 12/30/2022 Chief Complaint Document Details Patient Name: Date of Service: DEHA [name] 12/30/2022 2:45 PM Medical Record Number: [id number] Patient Account Number: [id number] Date of Birth/Sex: Treating RN: 08-05-1936 (87 y.o. F) Primary Care Provider: Jerene Bears Other Clinician: Referring Provider: Treating Provider/Extender: Kathe Becton Weeks in Treatment: 5 Information Obtained from: Patient Chief Complaint Patient seen for complaints of Non-Healing Wound. Electronic Signature(s) Signed: 12/30/2022 3:42:02 PM By: Fredirick Maudlin MD FACS Entered By: Fredirick Maudlin on 12/30/2022 15:42:02 -------------------------------------------------------------------------------- Debridement Details Patient Name: Date of Service: [name], [name] 12/30/2022 2:45 PM Medical Record Number: [id number] Patient Account Number: [id number] Date of Birth/Sex: Treating RN: 1936-10-05 (87 y.o. [name] Primary Care Provider: Jerene Bears Other Clinician: Referring Provider: Treating Provider/Extender: Kathe Becton Weeks in Treatment: 5 Debridement Performed for Assessment: Wound #1 Right Lower Leg Performed By: Physician Fredirick Maudlin, MD Debridement Type: Debridement Severity of Tissue Pre Debridement: Fat layer exposed Level of Consciousness (Pre-procedure): Awake and Alert Pre-procedure Verification/Time Out Yes - 15:10 Taken: Start Time: 15:14 Pain Control: Lidocaine 5% topical ointment T Area Debrided (L x W): otal 2.2 (cm) x 0.5 (cm) = 1.1 (cm) Tissue and other material debrided: Non-Viable, Eschar, Slough, Slough Level: Non-Viable Tissue Debridement Description: Selective/Open Wound Instrument: Curette Bleeding: Minimum Hemostasis Achieved: Pressure Procedural Pain: 0 Post Procedural Pain: 0 Response to Treatment: Procedure was  tolerated well Level of Consciousness (Post- Awake and Alert procedure): Post Debridement Measurements of Total Wound Length: (cm) 2.2 Width: (cm) 0.5 Depth: (cm) 0.1 Volume: (cm) 0.086 Character of Wound/Ulcer Post Debridement: Improved Severity of Tissue Post Debridement: Fat layer exposed [name] ([id number]) [id number] Page 2 of 7 Post Procedure Diagnosis Same as Pre-procedure Notes scribed for Dr Celine Ahr by Sharyn Creamer, RN Electronic Signature(s) Signed: 12/30/2022 3:55:34 PM By: Fredirick Maudlin MD FACS Signed: 12/30/2022 3:57:27 PM By: Sharyn Creamer RN, BSN Entered By: Sharyn Creamer on 12/30/2022 15:16:07 -------------------------------------------------------------------------------- HPI Details Patient Name: Date of Service: [name], [name] 12/30/2022 2:45 PM Medical Record Number: [id number] Patient Account Number: [id number] Date of Birth/Sex: Treating RN: 05-Feb-1936 (87 y.o. F) Primary Care Provider: Jerene Bears Other Clinician: Referring Provider: Treating Provider/Extender: Milas Kocher, Dhruv Weeks in Treatment: 5 History of Present Illness HPI Description: ADMISSION 11/19/2022 This is an 87 year old woman who was apparently diagnosed with cellulitis in October and placed on doxycycline. She was on doxycycline for over a month. She developed an ulcer on her right ankle. It apparently expanded fairly dramatically over the course of about a week and as a result, she sought treatment at a local emergency room. She was seen on November 16, 2022. At that point, the doxycycline was discontinued and clindamycin prescribed. She was referred to the wound care center for further evaluation and management. She says that since starting the clindamycin, the erythema in her leg has resolved almost completely. She has a triangular wound on her right lower leg, just medial to the anterior tibial surface. There is a thick layer of  eschar on the surface. There is no malodor or purulent drainage. Periwound skin is intact. 12/13; this is a patient with a wound on the right lower extremity. We have been using Iodoflex under 3 layer compression. She is completing her clindamycin 12/11/2022: The patient has been experiencing more pain in her leg, lateral to the wound and up towards her knee; she states the wound  itself does not hurt. Secondary to this discomfort, she removed her compression wrap. The wound is a little bit smaller today and has just a bit of slough and eschar on the surface. 12/23/2022: The wound is smaller and cleaner today and edema control is good. Small amount of slough on the wound surface. 12/30/2022: The wound is smaller again this week. The patient says that as of last Friday, she has not had any pain. Edema control is excellent. There is some eschar and slough present. Electronic Signature(s) Signed: 12/30/2022 3:42:47 PM By: Fredirick Maudlin MD FACS Entered By: Fredirick Maudlin on 12/30/2022 15:42:47 -------------------------------------------------------------------------------- Physical Exam Details Patient Name: Date of Service: [name], [name] 12/30/2022 2:45 PM Medical Record Number: [id number] Patient Account Number: [id number] Date of Birth/Sex: Treating RN: 03-Aug-1936 (87 y.o. F) Primary Care Provider: Jerene Bears Other Clinician: Referring Provider: Treating Provider/Extender: Milas Kocher, Dhruv Weeks in Treatment: 5 Constitutional Hypertensive, asymptomatic. . . . no acute distress. JADAE, STEINKE ([id number]) [id number] Page 3 of 7 Respiratory Normal work of breathing on room air. Notes 12/30/2022: The wound is smaller again this week. The patient says that as of last Friday, she has not had any pain. Edema control is excellent. There is some eschar and slough present. Electronic Signature(s) Signed: 12/30/2022 3:45:19 PM By: Fredirick Maudlin MD  FACS Entered By: Fredirick Maudlin on 12/30/2022 15:45:18 -------------------------------------------------------------------------------- Physician Orders Details Patient Name: Date of Service: [name], [name] 12/30/2022 2:45 PM Medical Record Number: [id number] Patient Account Number: [id number] Date of Birth/Sex: Treating RN: [birth year] (87 y.o. [name] Primary Care Provider: Jerene Bears Other Clinician: Referring Provider: Treating Provider/Extender: Kathe Becton Weeks in Treatment: 5 Verbal / Phone Orders: No Diagnosis Coding ICD-10 Coding Code Description O29.476 Non-pressure chronic ulcer of right ankle with fat layer exposed Follow-up Appointments ppointment in 1 week. - Dr. Celine Ahr - Room 2 245 pm 01/06/2023 Return A Anesthetic Wound #1 Right Lower Leg (In clinic) Topical Lidocaine 4% applied to wound bed Edema Control - Lymphedema / SCD / Other Right Lower Extremity Avoid standing for long periods of time. Wound Treatment Wound #1 - Lower Leg Wound Laterality: Right Cleanser: Soap and Water 1 x Per Week/30 Days Discharge Instructions: May shower and wash wound with dial antibacterial soap and water prior to dressing change. Peri-Wound Care: Sween Lotion (Moisturizing lotion) 1 x Per Week/30 Days Discharge Instructions: Apply moisturizing lotion as directed Prim Dressing: Hydrofera Blue Ready Transfer Foam, 2.5x2.5 (in/in) 1 x Per Week/30 Days ary Discharge Instructions: Apply directly to wound bed as directed Secondary Dressing: ABD Pad, 5x9 1 x Per Week/30 Days Discharge Instructions: Apply over primary dressing as directed. Secondary Dressing: Woven Gauze Sponge, Non-Sterile 4x4 in 1 x Per Week/30 Days Discharge Instructions: Apply over primary dressing as directed. Secured With: Elastic Bandage 4 inch (ACE bandage) 1 x Per Week/30 Days Discharge Instructions: Secure with ACE bandage as directed. Compression Wrap: Kerlix Roll 4.5x3.1  (in/yd) 1 x Per Week/30 Days Discharge Instructions: Apply Kerlix and Coban compression as directed. Electronic Signature(s) Signed: 12/30/2022 3:55:34 PM By: Fredirick Maudlin MD FACS Toma Deiters D ([id number]) [id number] Page 4 of 7 Entered By: Fredirick Maudlin on 12/30/2022 15:45:33 -------------------------------------------------------------------------------- Problem List Details Patient Name: Date of Service: [name], [name]. 12/30/2022 2:45 PM Medical Record Number: [id number] Patient Account Number: [id number] Date of Birth/Sex: Treating RN: May 25, 1936 (87 y.o. F) Primary Care Provider: Jerene Bears Other Clinician: Referring Provider: Treating Provider/Extender: Fredirick Maudlin  Jerene Bears Weeks in Treatment: 5 Active Problems ICD-10 Encounter Code Description Active Date MDM Diagnosis L97.312 Non-pressure chronic ulcer of right ankle with fat layer exposed 11/19/2022 No Yes Inactive Problems Resolved Problems Electronic Signature(s) Signed: 12/30/2022 3:41:24 PM By: Fredirick Maudlin MD FACS Entered By: Fredirick Maudlin on 12/30/2022 15:41:24 -------------------------------------------------------------------------------- Progress Note Details Patient Name: Date of Service: [name], [name] 12/30/2022 2:45 PM Medical Record Number: [id number] Patient Account Number: [id number] Date of Birth/Sex: Treating RN: March 06, 1936 (87 y.o. F) Primary Care Provider: Jerene Bears Other Clinician: Referring Provider: Treating Provider/Extender: Kathe Becton Weeks in Treatment: 5 Subjective Chief Complaint Information obtained from Patient Patient seen for complaints of Non-Healing Wound. History of Present Illness (HPI) ADMISSION 11/19/2022 This is an 87 year old woman who was apparently diagnosed with cellulitis in October and placed on doxycycline. She was on doxycycline for over a month. She developed an ulcer on her right ankle.  It apparently expanded fairly dramatically over the course of about a week and as a result, she sought treatment at a local emergency room. She was seen on November 16, 2022. At that point, the doxycycline was discontinued and clindamycin prescribed. She was referred to the wound care center for further evaluation and management. She says that since starting the clindamycin, the erythema in her leg has resolved almost completely. She has a triangular wound on her right lower leg, just medial to the anterior tibial surface. There is a thick layer of eschar on the surface. There is no malodor or purulent drainage. Periwound skin is intact. 12/13; this is a patient with a wound on the right lower extremity. We have been using Iodoflex under 3 layer compression. She is completing her clindamycin 12/11/2022: The patient has been experiencing more pain in her leg, lateral to the wound and up towards her knee; she states the wound itself does not hurt. Secondary to this discomfort, she removed her compression wrap. The wound is a little bit smaller today and has just a bit of slough and eschar on the surface. [name], [name] ([id number]) [id number] Page 5 of 7 12/23/2022: The wound is smaller and cleaner today and edema control is good. Small amount of slough on the wound surface. 12/30/2022: The wound is smaller again this week. The patient says that as of last Friday, she has not had any pain. Edema control is excellent. There is some eschar and slough present. Patient History Information obtained from Patient. Family History Cancer - Maternal Grandparents,Father, Diabetes - Paternal Grandparents,Father, Heart Disease - Mother, Hypertension - Mother, Stroke - Paternal Grandparents, Thyroid Problems - Mother,Child, No family history of Seizures. Social History Never smoker, Marital Status - Divorced, Alcohol Use - Never, Drug Use - No History, Caffeine Use - Daily. Medical  History Endocrine Patient has history of Type II Diabetes Musculoskeletal Patient has history of Osteoarthritis Objective Constitutional Hypertensive, asymptomatic. no acute distress. Vitals Time Taken: 3:09 PM, Height: 62 in, Weight: 161 lbs, BMI: 29.4, Temperature: 97.7 F, Pulse: 70 bpm, Respiratory Rate: 18 breaths/min, Blood Pressure: 179/88 mmHg. Respiratory Normal work of breathing on room air. General Notes: 12/30/2022: The wound is smaller again this week. The patient says that as of last Friday, she has not had any pain. Edema control is excellent. There is some eschar and slough present. Integumentary (Hair, Skin) Wound #1 status is Open. Original cause of wound was Skin T ear/Laceration. The date acquired was: 11/19/2022. The wound has been in treatment 5 weeks. The wound is located  on the Right Lower Leg. The wound measures 2.2cm length x 0.5cm width x 0.1cm depth; 0.864cm^2 area and 0.086cm^3 volume. There is Fat Layer (Subcutaneous Tissue) exposed. There is no tunneling or undermining noted. There is a medium amount of serosanguineous drainage noted. The wound margin is distinct with the outline attached to the wound base. There is small (1-33%) granulation within the wound bed. There is a large (67-100%) amount of necrotic tissue within the wound bed including Eschar and Adherent Slough. The periwound skin appearance did not exhibit: Callus, Crepitus, Excoriation, Induration, Rash, Scarring, Dry/Scaly, Maceration, Atrophie Blanche, Cyanosis, Ecchymosis, Hemosiderin Staining, Mottled, Pallor, Rubor, Erythema. Assessment Active Problems ICD-10 Non-pressure chronic ulcer of right ankle with fat layer exposed Procedures Wound #1 Pre-procedure diagnosis of Wound #1 is a Diabetic Wound/Ulcer of the Lower Extremity located on the Right Lower Leg .Severity of Tissue Pre Debridement is: Fat layer exposed. There was a Selective/Open Wound Non-Viable Tissue Debridement with a total  area of 1.1 sq cm performed by Fredirick Maudlin, MD. With the following instrument(s): Curette to remove Non-Viable tissue/material. Material removed includes Eschar and Slough and after achieving pain control using Lidocaine 5% topical ointment. No specimens were taken. A time out was conducted at 15:10, prior to the start of the procedure. A Minimum amount of bleeding was controlled with Pressure. The procedure was tolerated well with a pain level of 0 throughout and a pain level of 0 following the procedure. Post Debridement Measurements: 2.2cm length x 0.5cm width x 0.1cm depth; 0.086cm^3 volume. Character of Wound/Ulcer Post Debridement is improved. Severity of Tissue Post Debridement is: Fat layer exposed. Post procedure Diagnosis Wound #1: Same as Pre-Procedure General Notes: scribed for Dr Celine Ahr by Sharyn Creamer, RN. [name], [name] ([id number]) [id number] Page 6 of 7 Plan Follow-up Appointments: Return Appointment in 1 week. - Dr. Celine Ahr - Room 2 245 pm 01/06/2023 Anesthetic: Wound #1 Right Lower Leg: (In clinic) Topical Lidocaine 4% applied to wound bed Edema Control - Lymphedema / SCD / Other: Avoid standing for long periods of time. WOUND #1: - Lower Leg Wound Laterality: Right Cleanser: Soap and Water 1 x Per Week/30 Days Discharge Instructions: May shower and wash wound with dial antibacterial soap and water prior to dressing change. Peri-Wound Care: Sween Lotion (Moisturizing lotion) 1 x Per Week/30 Days Discharge Instructions: Apply moisturizing lotion as directed Prim Dressing: Hydrofera Blue Ready Transfer Foam, 2.5x2.5 (in/in) 1 x Per Week/30 Days ary Discharge Instructions: Apply directly to wound bed as directed Secondary Dressing: ABD Pad, 5x9 1 x Per Week/30 Days Discharge Instructions: Apply over primary dressing as directed. Secondary Dressing: Woven Gauze Sponge, Non-Sterile 4x4 in 1 x Per Week/30 Days Discharge Instructions: Apply  over primary dressing as directed. Secured With: Elastic Bandage 4 inch (ACE bandage) 1 x Per Week/30 Days Discharge Instructions: Secure with ACE bandage as directed. Com pression Wrap: Kerlix Roll 4.5x3.1 (in/yd) 1 x Per Week/30 Days Discharge Instructions: Apply Kerlix and Coban compression as directed. 12/30/2022: The wound is smaller again this week. The patient says that as of last Friday, she has not had any pain. Edema control is excellent. There is some eschar and slough present. I used a curette to debride slough and eschar from the wound. Once the eschar was removed, the wound was even smaller than initially appreciated. Continue Hydrofera Blue, Kerlix, and Ace bandage. I anticipate that she will be healed in the next 1 to 2 weeks. I will see her in 1 week's time. Electronic Signature(s) Signed:  12/30/2022 3:46:13 PM By: Fredirick Maudlin MD FACS Entered By: Fredirick Maudlin on 12/30/2022 15:46:13 -------------------------------------------------------------------------------- HxROS Details Patient Name: Date of Service: [name], [name] 12/30/2022 2:45 PM Medical Record Number: [id number] Patient Account Number: [id number] Date of Birth/Sex: Treating RN: Apr 07, 1936 (87 y.o. F) Primary Care Provider: Jerene Bears Other Clinician: Referring Provider: Treating Provider/Extender: Kathe Becton Weeks in Treatment: 5 Information Obtained From Patient Endocrine Medical History: Positive for: Type II Diabetes Time with diabetes: 30 yrs ago Blood sugar tested every day: Yes Tested : Musculoskeletal Medical History: Positive for: Osteoarthritis Immunizations Pneumococcal Vaccine: Received Pneumococcal Vaccination: No ALAUNA, HAYDEN ([id number]) [id number] Page 7 of 7 Implantable Devices None Family and Social History Cancer: Yes - Maternal Grandparents,Father; Diabetes: Yes - Paternal Grandparents,Father; Heart Disease: Yes - Mother;  Hypertension: Yes - Mother; Seizures: No; Stroke: Yes - Paternal Grandparents; Thyroid Problems: Yes - Mother,Child; Never smoker; Marital Status - Divorced; Alcohol Use: Never; Drug Use: No History; Caffeine Use: Daily; Financial Concerns: No; Food, Clothing or Shelter Needs: No; Support System Lacking: No; Transportation Concerns: No Electronic Signature(s) Signed: 12/30/2022 3:55:34 PM By: Fredirick Maudlin MD FACS Entered By: Fredirick Maudlin on 12/30/2022 15:42:53 -------------------------------------------------------------------------------- SuperBill Details Patient Name: Date of Service: [name], [name] 12/30/2022 Medical Record Number: [id number] Patient Account Number: [id number] Date of Birth/Sex: Treating RN: Apr 08, 1936 (87 y.o. F) Primary Care Provider: Jerene Bears Other Clinician: Referring Provider: Treating Provider/Extender: Milas Kocher, Dhruv Weeks in Treatment: 5 Diagnosis Coding ICD-10 Codes Code Description [phone] Non-pressure chronic ulcer of right ankle with fat layer exposed Facility Procedures : 7 CPT4 Code: [id number] Description: 75797 - DEBRIDE WOUND 1ST 20 SQ CM OR < ICD-10 Diagnosis Description K82.060 Non-pressure chronic ulcer of right ankle with fat layer exposed Modifier: Quantity: 1 Physician Procedures : CPT4 Code Description Modifier [id number] - WC PHYS LEVEL 3 - EST PT 25 ICD-10 Diagnosis Description M14.709 Non-pressure chronic ulcer of right ankle with fat layer exposed Quantity: 1 : [id number] - WC PHYS DEBR WO ANESTH 20 SQ CM ICD-10 Diagnosis Description D64.383 Non-pressure chronic ulcer of right ankle with fat layer exposed Quantity: 1 Electronic Signature(s) Signed: 12/30/2022 3:46:28 PM By: Fredirick Maudlin MD FACS Entered By: Fredirick Maudlin on 12/30/2022 15:46:28

## 2023-01-06 ENCOUNTER — Encounter (HOSPITAL_BASED_OUTPATIENT_CLINIC_OR_DEPARTMENT_OTHER): Payer: Medicare Other | Admitting: General Surgery

## 2023-01-06 DIAGNOSIS — I890 Lymphedema, not elsewhere classified: Secondary | ICD-10-CM | POA: Diagnosis not present

## 2023-01-06 DIAGNOSIS — E11621 Type 2 diabetes mellitus with foot ulcer: Secondary | ICD-10-CM | POA: Diagnosis not present

## 2023-01-06 DIAGNOSIS — L97312 Non-pressure chronic ulcer of right ankle with fat layer exposed: Secondary | ICD-10-CM | POA: Diagnosis not present

## 2023-01-06 DIAGNOSIS — Z833 Family history of diabetes mellitus: Secondary | ICD-10-CM | POA: Diagnosis not present

## 2023-01-06 DIAGNOSIS — S81811A Laceration without foreign body, right lower leg, initial encounter: Secondary | ICD-10-CM | POA: Diagnosis not present

## 2023-01-06 NOTE — Progress Notes (Addendum)
[name] ([id number]) [id number] Page 1 of 8 Visit Report for 01/06/2023 Arrival Information Details Patient Name: Date of Service: [name] 01/06/2023 2:45 PM Medical Record Number: [id number] Patient Account Number: [id number] Date of Birth/Sex: Treating RN: 1936-11-14 (87 y.o. [name] Primary Care Sera Hitsman: Jerene Bears Other Clinician: Referring Porschea Borys: Treating Naszir Cott/Extender: Kathe Becton Weeks in Treatment: 6 Visit Information History Since Last Visit Added or deleted any medications: No Patient Arrived: Ambulatory Any new allergies or adverse reactions: No Arrival Time: 14:40 Had a fall or experienced change in No Accompanied By: daughter activities of daily living that may affect Transfer Assistance: None risk of falls: Patient Identification Verified: Yes Signs or symptoms of abuse/neglect since last visito No Secondary Verification Process Completed: Yes Hospitalized since last visit: No Implantable device outside of the clinic excluding No cellular tissue based products placed in the center since last visit: Has Dressing in Place as Prescribed: Yes Has Compression in Place as Prescribed: Yes Pain Present Now: No Electronic Signature(s) Signed: 01/06/2023 3:23:10 PM By: Adline Peals Entered By: Adline Peals on 01/06/2023 14:41:11 -------------------------------------------------------------------------------- Clinic Level of Care Assessment Details Patient Name: Date of Service: Deborah RTSilvestre Moment 01/06/2023 2:45 PM Medical Record Number: [id number] Patient Account Number: [id number] Date of Birth/Sex: Treating RN: 12-01-1936 (87 y.o. [name] Primary Care Marcile Fuquay: Jerene Bears Other Clinician: Referring Nathanyl Andujo: Treating Toy Samarin/Extender: Kathe Becton Weeks in Treatment: 6 Clinic Level of Care Assessment Items TOOL 4 Quantity Score X- 1  0 Use when only an EandM is performed on FOLLOW-UP visit ASSESSMENTS - Nursing Assessment / Reassessment X- 1 10 Reassessment of Co-morbidities (includes updates in patient status) X- 1 5 Reassessment of Adherence to Treatment Plan ASSESSMENTS - Wound and Skin A ssessment / Reassessment X - Simple Wound Assessment / Reassessment - one wound 1 5 '[]'$  - 0 Complex Wound Assessment / Reassessment - multiple wounds '[]'$  - 0 Dermatologic / Skin Assessment (not related to wound area) ASSESSMENTS - Focused Assessment X- 1 5 Circumferential Edema Measurements - multi extremities '[]'$  - 0 Nutritional Assessment / Counseling / Intervention Deborah, Gonzalez ([id number]) [id number] Page 2 of 8 X- 1 5 Lower Extremity Assessment (monofilament, tuning fork, pulses) '[]'$  - 0 Peripheral Arterial Disease Assessment (using hand held doppler) ASSESSMENTS - Ostomy and/or Continence Assessment and Care '[]'$  - 0 Incontinence Assessment and Management '[]'$  - 0 Ostomy Care Assessment and Management (repouching, etc.) PROCESS - Coordination of Care X - Simple Patient / Family Education for ongoing care 1 15 '[]'$  - 0 Complex (extensive) Patient / Family Education for ongoing care X- 1 10 Staff obtains Programmer, systems, Records, T Results / Process Orders est '[]'$  - 0 Staff telephones HHA, Nursing Homes / Clarify orders / etc '[]'$  - 0 Routine Transfer to another Facility (non-emergent condition) '[]'$  - 0 Routine Hospital Admission (non-emergent condition) '[]'$  - 0 New Admissions / Biomedical engineer / Ordering NPWT Apligraf, etc. , '[]'$  - 0 Emergency Hospital Admission (emergent condition) X- 1 10 Simple Discharge Coordination '[]'$  - 0 Complex (extensive) Discharge Coordination PROCESS - Special Needs '[]'$  - 0 Pediatric / Minor Patient Management '[]'$  - 0 Isolation Patient Management '[]'$  - 0 Hearing / Language / Visual special needs '[]'$  - 0 Assessment of Community assistance (transportation, Gonzalez/C  planning, etc.) '[]'$  - 0 Additional assistance / Altered mentation '[]'$  - 0 Support Surface(s) Assessment (bed, cushion, seat, etc.) INTERVENTIONS - Wound Cleansing / Measurement '[]'$  - 0 Simple Wound  Cleansing - one wound '[]'$  - 0 Complex Wound Cleansing - multiple wounds X- 1 5 Wound Imaging (photographs - any number of wounds) '[]'$  - 0 Wound Tracing (instead of photographs) '[]'$  - 0 Simple Wound Measurement - one wound '[]'$  - 0 Complex Wound Measurement - multiple wounds INTERVENTIONS - Wound Dressings '[]'$  - 0 Small Wound Dressing one or multiple wounds '[]'$  - 0 Medium Wound Dressing one or multiple wounds '[]'$  - 0 Large Wound Dressing one or multiple wounds '[]'$  - 0 Application of Medications - topical '[]'$  - 0 Application of Medications - injection INTERVENTIONS - Miscellaneous '[]'$  - 0 External ear exam '[]'$  - 0 Specimen Collection (cultures, biopsies, blood, body fluids, etc.) '[]'$  - 0 Specimen(s) / Culture(s) sent or taken to Lab for analysis '[]'$  - 0 Patient Transfer (multiple staff / Civil Service fast streamer / Similar devices) '[]'$  - 0 Simple Staple / Suture removal (25 or less) '[]'$  - 0 Complex Staple / Suture removal (26 or more) '[]'$  - 0 Hypo / Hyperglycemic Management (close monitor of Blood Glucose) [name] ([id number]) [id number] Page 3 of 8 '[]'$  - 0 Ankle / Brachial Index (ABI) - do not check if billed separately X- 1 5 Vital Signs Has the patient been seen at the hospital within the last three years: Yes Total Score: 75 Level Of Care: New/Established - Level 2 Electronic Signature(s) Signed: 01/06/2023 3:23:10 PM By: Adline Peals Entered By: Adline Peals on 01/06/2023 14:55:37 -------------------------------------------------------------------------------- Encounter Discharge Information Details Patient Name: Date of Service: Deborah RT, [name] 01/06/2023 2:45 PM Medical Record Number: [id number] Patient Account Number: [id number] Date of  Birth/Sex: Treating RN: [birth year] (87 y.o. [name] Primary Care Lenoard Helbert: Jerene Bears Other Clinician: Referring Pasco Marchitto: Treating Thanh Pomerleau/Extender: Kathe Becton Weeks in Treatment: 6 Encounter Discharge Information Items Discharge Condition: Stable Ambulatory Status: Ambulatory Discharge Destination: Home Transportation: Private Auto Accompanied By: daughter Schedule Follow-up Appointment: Yes Clinical Summary of Care: Patient Declined Electronic Signature(s) Signed: 01/06/2023 3:23:10 PM By: Adline Peals Entered By: Adline Peals on 01/06/2023 14:55:58 -------------------------------------------------------------------------------- Lower Extremity Assessment Details Patient Name: Date of Service: Deborah RT, [name] 01/06/2023 2:45 PM Medical Record Number: [id number] Patient Account Number: [id number] Date of Birth/Sex: Treating RN: 1936/07/28 (87 y.o. [name] Primary Care Brekyn Huntoon: Jerene Bears Other Clinician: Referring Zyquan Crotty: Treating Pesach Frisch/Extender: Milas Kocher, Dhruv Weeks in Treatment: 6 Edema Assessment Assessed: [Left: No] [Right: No] [Left: Edema] [Right: :] Calf Left: Right: Point of Measurement: 28 cm From Medial Instep 34 cm Ankle Left: Right: Point of Measurement: 11 cm From Medial Instep 21.2 cm Vascular Assessment Left: [[id number] Page 4 of 8Right:] Pulses: Dorsalis Pedis Palpable: [phone].pdf Page 4 of 8Yes] Electronic Signature(s) Signed: 01/06/2023 3:23:10 PM By: Sabas Sous By: Adline Peals on 01/06/2023 14:43:33 -------------------------------------------------------------------------------- Multi Wound Chart Details Patient Name: Date of Service: Deborah RT, A Iona Beard Gonzalez. 01/06/2023 2:45 PM Medical Record Number: [id number] Patient Account Number: [id number] Date of Birth/Sex: Treating RN: [birth year] (87 y.o.  F) Primary Care Brynn Reznik: Jerene Bears Other Clinician: Referring Shevawn Langenberg: Treating Yaqub Arney/Extender: Milas Kocher, Dhruv Weeks in Treatment: 6 Vital Signs Height(in): 62 Pulse(bpm): 78 Weight(lbs): 161 Blood Pressure(mmHg): 220/95 Body Mass Index(BMI): 29.4 Temperature(F): 97.8 Respiratory Rate(breaths/min): 18 [1:Photos:] [N/A:N/A] Right Lower Leg N/A N/A Wound Location: Skin T ear/Laceration N/A N/A Wounding Event: Diabetic Wound/Ulcer of the Lower N/A N/A Primary Etiology: Extremity Type II Diabetes, Osteoarthritis N/A N/A Comorbid History: 11/19/2022 N/A N/A Date Acquired: 6 N/A N/A Weeks of Treatment:  Healed - Epithelialized N/A N/A Wound Status: No N/A N/A Wound Recurrence: 0x0x0 N/A N/A Measurements L x W x Gonzalez (cm) 0 N/A N/A A (cm) : rea 0 N/A N/A Volume (cm) : 100.00% N/A N/A % Reduction in A rea: 100.00% N/A N/A % Reduction in Volume: Grade 2 N/A N/A Classification: None Present N/A N/A Exudate A mount: Distinct, outline attached N/A N/A Wound Margin: None Present (0%) N/A N/A Granulation A mount: None Present (0%) N/A N/A Necrotic A mount: Fascia: No N/A N/A Exposed Structures: Fat Layer (Subcutaneous Tissue): No Tendon: No Muscle: No Joint: No Bone: No Large (67-100%) N/A N/A Epithelialization: Excoriation: No N/A N/A Periwound Skin Texture: Induration: No Callus: No Crepitus: No Rash: No Scarring: No Maceration: No N/A N/A Periwound Skin Moisture: Dry/Scaly: No Deborah, Gonzalez ([id number]) [id number] Page 5 of 8 Atrophie Blanche: No N/A N/A Periwound Skin Color: Cyanosis: No Ecchymosis: No Erythema: No Hemosiderin Staining: No Mottled: No Pallor: No Rubor: No No Abnormality N/A N/A Temperature: Treatment Notes Electronic Signature(s) Signed: 01/06/2023 3:53:25 PM By: Fredirick Maudlin MD FACS Entered By: Fredirick Maudlin on 01/06/2023  15:53:25 -------------------------------------------------------------------------------- Multi-Disciplinary Care Plan Details Patient Name: Date of Service: [name] RT, [name] 01/06/2023 2:45 PM Medical Record Number: [id number] Patient Account Number: [id number] Date of Birth/Sex: Treating RN: 08-05-1936 (87 y.o. [name] Primary Care Shulem Mader: Jerene Bears Other Clinician: Referring Yuto Cajuste: Treating Basil Buffin/Extender: Milas Kocher, Dhruv Weeks in Treatment: 6 Active Inactive Electronic Signature(s) Signed: 01/06/2023 3:23:10 PM By: Sabas Sous By: Adline Peals on 01/06/2023 14:53:59 -------------------------------------------------------------------------------- Pain Assessment Details Patient Name: Date of Service: Deborah RT, [name] 01/06/2023 2:45 PM Medical Record Number: [id number] Patient Account Number: [id number] Date of Birth/Sex: Treating RN: Mar 03, 1936 (87 y.o. [name] Primary Care Mackson Botz: Jerene Bears Other Clinician: Referring Janele Lague: Treating Shekera Beavers/Extender: Kathe Becton Weeks in Treatment: 6 Active Problems Location of Pain Severity and Description of Pain Patient Has Paino No Site Locations Rate the pain. Deborah, Gonzalez ([id number]) [id number] Page 6 of 8 Rate the pain. Current Pain Level: 0 Pain Management and Medication Current Pain Management: Electronic Signature(s) Signed: 01/06/2023 3:23:10 PM By: Adline Peals Entered By: Adline Peals on 01/06/2023 14:41:29 -------------------------------------------------------------------------------- Patient/Caregiver Education Details Patient Name: Date of Service: [name] 1/23/2024andnbsp2:45 PM Medical Record Number: [id number] Patient Account Number: [id number] Date of Birth/Gender: Treating RN: Jul 12, 1936 (87 y.o. [name] Primary Care Physician: Jerene Bears Other  Clinician: Referring Physician: Treating Physician/Extender: Kathe Becton Weeks in Treatment: 6 Education Assessment Education Provided To: Patient Education Topics Provided Safety: Methods: Explain/Verbal Responses: Reinforcements needed, State content correctly Electronic Signature(s) Signed: 01/06/2023 3:23:10 PM By: Adline Peals Entered By: Adline Peals on 01/06/2023 14:49:53 -------------------------------------------------------------------------------- Wound Assessment Details Patient Name: Date of Service: Deborah RT, [name] 01/06/2023 2:45 PM Medical Record Number: [id number] Patient Account Number: [id number] Date of Birth/Sex: Treating RN: August 24, 1936 (87 y.o. [name] Primary Care Colston Pyle: Jerene Bears Other Clinician: Referring Maree Ainley: Treating Jasemine Nawaz/Extender: Kathe Becton [name], [name] ([id number]) [id number] Page 7 of 8 Weeks in Treatment: 6 Wound Status Wound Number: 1 Primary Etiology: Diabetic Wound/Ulcer of the Lower Extremity Wound Location: Right Lower Leg Wound Status: Healed - Epithelialized Wounding Event: Skin Tear/Laceration Comorbid History: Type II Diabetes, Osteoarthritis Date Acquired: 11/19/2022 Weeks Of Treatment: 6 Clustered Wound: No Photos Wound Measurements Length: (cm) Width: (cm) Depth: (cm) Area: (cm) Volume: (cm) 0 % Reduction in Area: 100% 0 %  Reduction in Volume: 100% 0 Epithelialization: Large (67-100%) 0 Tunneling: No 0 Undermining: No Wound Description Classification: Grade 2 Wound Margin: Distinct, outline attached Exudate Amount: None Present Foul Odor After Cleansing: No Slough/Fibrino No Wound Bed Granulation Amount: None Present (0%) Exposed Structure Necrotic Amount: None Present (0%) Fascia Exposed: No Fat Layer (Subcutaneous Tissue) Exposed: No Tendon Exposed: No Muscle Exposed: No Joint Exposed: No Bone Exposed:  No Periwound Skin Texture Texture Color No Abnormalities Noted: No No Abnormalities Noted: No Callus: No Atrophie Blanche: No Crepitus: No Cyanosis: No Excoriation: No Ecchymosis: No Induration: No Erythema: No Rash: No Hemosiderin Staining: No Scarring: No Mottled: No Pallor: No Moisture Rubor: No No Abnormalities Noted: No Dry / Scaly: No Temperature / Pain Maceration: No Temperature: No Abnormality Electronic Signature(s) Signed: 01/06/2023 3:23:10 PM By: Adline Peals Entered By: Adline Peals on 01/06/2023 14:53:03 Vitals Details -------------------------------------------------------------------------------- [name] ([id number]) [id number] Page 8 of 8 Patient Name: Date of Service: [name] 01/06/2023 2:45 PM Medical Record Number: [id number] Patient Account Number: [id number] Date of Birth/Sex: Treating RN: 1936-10-25 (87 y.o. [name] Primary Care Leslie Jester: Jerene Bears Other Clinician: Referring Lealon Vanputten: Treating Terralyn Matsumura/Extender: Kathe Becton Weeks in Treatment: 6 Vital Signs Time Taken: 14:41 Temperature (F): 97.8 Height (in): 62 Pulse (bpm): 78 Weight (lbs): 161 Respiratory Rate (breaths/min): 18 Body Mass Index (BMI): 29.4 Blood Pressure (mmHg): 220/95 Reference Range: 80 - 120 mg / dl Electronic Signature(s) Signed: 01/06/2023 3:23:10 PM By: Adline Peals Entered By: Adline Peals on 01/06/2023 14:41:24

## 2023-01-06 NOTE — Progress Notes (Signed)
[name], [name] ([id number]) [id number] Page 1 of 6 Visit Report for 01/06/2023 Chief Complaint Document Details Patient Name: Date of Service: [name] 01/06/2023 2:45 PM Medical Record Number: [id number] Patient Account Number: [id number] Date of Birth/Sex: Treating RN: February 25, 1936 (87 y.o. F) Primary Care Provider: Jerene Bears Other Clinician: Referring Provider: Treating Provider/Extender: Kathe Becton Weeks in Treatment: 6 Information Obtained from: Patient Chief Complaint Patient seen for complaints of Non-Healing Wound. Electronic Signature(s) Signed: 01/06/2023 3:53:33 PM By: Fredirick Maudlin MD FACS Entered By: Fredirick Maudlin on 01/06/2023 15:53:33 -------------------------------------------------------------------------------- HPI Details Patient Name: Date of Service: [name], Deborah [name]. 01/06/2023 2:45 PM Medical Record Number: [id number] Patient Account Number: [id number] Date of Birth/Sex: Treating RN: May 24, 1936 (87 y.o. F) Primary Care Provider: Jerene Bears Other Clinician: Referring Provider: Treating Provider/Extender: Milas Kocher, Dhruv Weeks in Treatment: 6 History of Present Illness HPI Description: ADMISSION 11/19/2022 This is an 87 year old woman who was apparently diagnosed with cellulitis in October and placed on doxycycline. She was on doxycycline for over Deborah month. She developed an ulcer on her right ankle. It apparently expanded fairly dramatically over the course of about Deborah week and as Deborah result, she sought treatment at Deborah local emergency room. She was seen on November 16, 2022. At that point, the doxycycline was discontinued and clindamycin prescribed. She was referred to the wound care center for further evaluation and management. She says that since starting the clindamycin, the erythema in her leg has resolved almost completely. She has Deborah triangular wound on her right lower leg, just medial  to the anterior tibial surface. There is Deborah thick layer of eschar on the surface. There is no malodor or purulent drainage. Periwound skin is intact. 12/13; this is Deborah patient with Deborah wound on the right lower extremity. We have been using Iodoflex under 3 layer compression. She is completing her clindamycin 12/11/2022: The patient has been experiencing more pain in her leg, lateral to the wound and up towards her knee; she states the wound itself does not hurt. Secondary to this discomfort, she removed her compression wrap. The wound is Deborah little bit smaller today and has just Deborah bit of slough and eschar on the surface. 12/23/2022: The wound is smaller and cleaner today and edema control is good. Small amount of slough on the wound surface. 12/30/2022: The wound is smaller again this week. The patient says that as of last Friday, she has not had any pain. Edema control is excellent. There is some eschar and slough present. 01/06/2023: Her wound is healed Electronic Signature(s) Signed: 01/06/2023 3:53:50 PM By: Fredirick Maudlin MD FACS Entered By: Fredirick Maudlin on 01/06/2023 15:53:49 [name] ([id number]) [id number] Page 2 of 6 -------------------------------------------------------------------------------- Physical Exam Details Patient Name: Date of Service: [name], [name] 01/06/2023 2:45 PM Medical Record Number: [id number] Patient Account Number: [id number] Date of Birth/Sex: Treating RN: [birth year] (87 y.o. F) Primary Care Provider: Jerene Bears Other Clinician: Referring Provider: Treating Provider/Extender: Milas Kocher, Dhruv Weeks in Treatment: 6 Constitutional Hypertensive, asymptomatic; she reports whitecoat hypertension. . . . no acute distress. Respiratory Normal work of breathing on room air. Notes 01/06/2023: Her wound is healed Electronic Signature(s) Signed: 01/06/2023 3:54:36 PM By: Fredirick Maudlin MD FACS Entered By: Fredirick Maudlin on 01/06/2023 15:54:36 -------------------------------------------------------------------------------- Physician Orders Details Patient Name: Date of Service: [name], Deborah [name]. 01/06/2023 2:45 PM Medical Record Number: [id number] Patient Account Number: [id number] Date of Birth/Sex:  Treating RN: 1936-09-15 (87 y.o. [name] Primary Care Provider: Jerene Bears Other Clinician: Referring Provider: Treating Provider/Extender: Kathe Becton Weeks in Treatment: 6 Verbal / Phone Orders: No Diagnosis Coding ICD-10 Coding Code Description I94.854 Non-pressure chronic ulcer of right ankle with fat layer exposed Discharge From [facility] Services Discharge from [place]!!!!!! Anesthetic (In clinic) Topical Lidocaine 5% applied to wound bed Edema Control - Lymphedema / SCD / Other Right Lower Extremity Avoid standing for long periods of time. Patient Medications llergies: penicillin, acetaminophen, ciprofloxacin, naproxen, Septra, Motrin Deborah Notifications Medication Indication Start End 01/06/2023 lidocaine DOSE topical 5 % ointment - ointment topical Electronic Signature(s) [name], [name] ([id number]) [id number] Page 3 of 6 Signed: 01/06/2023 4:04:25 PM By: Fredirick Maudlin MD FACS Previous Signature: 01/06/2023 3:23:10 PM Version By: Sabas Sous By: Fredirick Maudlin on 01/06/2023 16:04:25 -------------------------------------------------------------------------------- Problem List Details Patient Name: Date of Service: [name], Deborah [name]. 01/06/2023 2:45 PM Medical Record Number: [id number] Patient Account Number: [id number] Date of Birth/Sex: Treating RN: 1936-08-04 (87 y.o. F) Primary Care Provider: Jerene Bears Other Clinician: Referring Provider: Treating Provider/Extender: Milas Kocher, Dhruv Weeks in Treatment: 6 Active Problems ICD-10 Encounter Code Description Active  Date MDM Diagnosis L97.312 Non-pressure chronic ulcer of right ankle with fat layer exposed 11/19/2022 No Yes Inactive Problems Resolved Problems Electronic Signature(s) Signed: 01/06/2023 3:53:18 PM By: Fredirick Maudlin MD FACS Entered By: Fredirick Maudlin on 01/06/2023 15:53:18 -------------------------------------------------------------------------------- Progress Note Details Patient Name: Date of Service: [name], Deborah Iona Beard Gonzalez. 01/06/2023 2:45 PM Medical Record Number: [id number] Patient Account Number: [id number] Date of Birth/Sex: Treating RN: Apr 04, 1936 (87 y.o. F) Primary Care Provider: Jerene Bears Other Clinician: Referring Provider: Treating Provider/Extender: Kathe Becton Weeks in Treatment: 6 Subjective Chief Complaint Information obtained from Patient Patient seen for complaints of Non-Healing Wound. History of Present Illness (HPI) ADMISSION 11/19/2022 This is an 87 year old woman who was apparently diagnosed with cellulitis in October and placed on doxycycline. She was on doxycycline for over Deborah month. She developed an ulcer on her right ankle. It apparently expanded fairly dramatically over the course of about Deborah week and as Deborah result, she sought treatment at Deborah local emergency room. She was seen on November 16, 2022. At that point, the doxycycline was discontinued and clindamycin prescribed. She was referred to the wound care center for further evaluation and management. She says that since starting the clindamycin, the erythema in her leg has resolved almost completely. She has Deborah triangular wound on her right lower leg, just medial to the anterior tibial surface. There is Deborah thick layer of eschar on the surface. There is no malodor or purulent drainage. Periwound skin is intact. 12/13; this is Deborah patient with Deborah wound on the right lower extremity. We have been using Iodoflex under 3 layer compression. She is completing her clindamycin [name], [name] ([id number])  [id number] Page 4 of 6 12/11/2022: The patient has been experiencing more pain in her leg, lateral to the wound and up towards her knee; she states the wound itself does not hurt. Secondary to this discomfort, she removed her compression wrap. The wound is Deborah little bit smaller today and has just Deborah bit of slough and eschar on the surface. 12/23/2022: The wound is smaller and cleaner today and edema control is good. Small amount of slough on the wound surface. 12/30/2022: The wound is smaller again this week. The patient says that as of last Friday, she has not  had any pain. Edema control is excellent. There is some eschar and slough present. 01/06/2023: Her wound is healed Patient History Information obtained from Patient. Family History Cancer - Maternal Grandparents,Father, Diabetes - Paternal Grandparents,Father, Heart Disease - Mother, Hypertension - Mother, Stroke - Paternal Grandparents, Thyroid Problems - Mother,Child, No family history of Seizures. Social History Never smoker, Marital Status - Divorced, Alcohol Use - Never, Drug Use - No History, Caffeine Use - Daily. Medical History Endocrine Patient has history of Type II Diabetes Musculoskeletal Patient has history of Osteoarthritis Objective Constitutional Hypertensive, asymptomatic; she reports whitecoat hypertension. no acute distress. Vitals Time Taken: 2:41 PM, Height: 62 in, Weight: 161 lbs, BMI: 29.4, Temperature: 97.8 F, Pulse: 78 bpm, Respiratory Rate: 18 breaths/min, Blood Pressure: 220/95 mmHg. Respiratory Normal work of breathing on room air. General Notes: 01/06/2023: Her wound is healed Integumentary (Hair, Skin) Wound #1 status is Healed - Epithelialized. Original cause of wound was Skin T ear/Laceration. The date acquired was: 11/19/2022. The wound has been in treatment 6 weeks. The wound is located on the Right Lower Leg. The wound measures 0cm length x 0cm width x 0cm depth; 0cm^2  area and 0cm^3 volume. There is no tunneling or undermining noted. There is Deborah none present amount of drainage noted. The wound margin is distinct with the outline attached to the wound base. There is no granulation within the wound bed. There is no necrotic tissue within the wound bed. The periwound skin appearance did not exhibit: Callus, Crepitus, Excoriation, Induration, Rash, Scarring, Dry/Scaly, Maceration, Atrophie Blanche, Cyanosis, Ecchymosis, Hemosiderin Staining, Mottled, Pallor, Rubor, Erythema. Periwound temperature was noted as No Abnormality. Assessment Active Problems ICD-10 Non-pressure chronic ulcer of right ankle with fat layer exposed Plan Discharge From [facility] Services: Discharge from [place]!!!!!! Anesthetic: (In clinic) Topical Lidocaine 5% applied to wound bed Edema Control - Lymphedema / SCD / Other: Avoid standing for long periods of time. The following medication(s) was prescribed: lidocaine topical 5 % ointment ointment topical was prescribed at facility [name], [name] ([id number]) [phone].pdf Page 5 of 6 01/06/2023: Her wound is healed. I have recommended that she consider using compression stockings on Deborah regular basis and elevate her legs throughout the day whenever possible and at night while she is sleeping. We will discharge her from the wound care center and she may follow-up as needed. Electronic Signature(s) Signed: 01/06/2023 4:05:02 PM By: Fredirick Maudlin MD FACS Entered By: Fredirick Maudlin on 01/06/2023 16:05:02 -------------------------------------------------------------------------------- HxROS Details Patient Name: Date of Service: [name], Deborah [name]. 01/06/2023 2:45 PM Medical Record Number: [id number] Patient Account Number: [id number] Date of Birth/Sex: Treating RN: Sep 27, 1936 (87 y.o. F) Primary Care Provider: Jerene Bears Other Clinician: Referring Provider: Treating Provider/Extender:  Kathe Becton Weeks in Treatment: 6 Information Obtained From Patient Endocrine Medical History: Positive for: Type II Diabetes Time with diabetes: 30 yrs ago Blood sugar tested every day: Yes Tested : Musculoskeletal Medical History: Positive for: Osteoarthritis Immunizations Pneumococcal Vaccine: Received Pneumococcal Vaccination: No Implantable Devices None Family and Social History Cancer: Yes - Maternal Grandparents,Father; Diabetes: Yes - Paternal Grandparents,Father; Heart Disease: Yes - Mother; Hypertension: Yes - Mother; Seizures: No; Stroke: Yes - Paternal Grandparents; Thyroid Problems: Yes - Mother,Child; Never smoker; Marital Status - Divorced; Alcohol Use: Never; Drug Use: No History; Caffeine Use: Daily; Financial Concerns: No; Food, Clothing or Shelter Needs: No; Support System Lacking: No; Transportation Concerns: No Engineer, maintenance) Signed: 01/06/2023 4:15:16 PM By: Fredirick Maudlin MD FACS Entered By: Celine Ahr,  Anderson Malta on 01/06/2023 15:53:55 -------------------------------------------------------------------------------- SuperBill Details Patient Name: Date of Service: [name] 01/06/2023 Medical Record Number: [id number] Patient Account Number: [id number] Date of Birth/Sex: Treating RN: 03-26-1936 (87 y.o. [name] Primary Care Provider: Jerene Bears Other Clinician: AMYIAH, [name] ([id number]) [id number] Page 6 of 6 Referring Provider: Treating Provider/Extender: Milas Kocher, Dhruv Weeks in Treatment: 6 Diagnosis Coding ICD-10 Codes Code Description Y77.412 Non-pressure chronic ulcer of right ankle with fat layer exposed Facility Procedures : 7 CPT4 Code: [id number] Description: [phone] - WOUND CARE VISIT-LEV 2 EST PT Modifier: Quantity: 1 Physician Procedures : CPT4 Code Description Modifier [id number] - WC PHYS LEVEL 2 - EST PT ICD-10 Diagnosis Description M54.650  Non-pressure chronic ulcer of right ankle with fat layer exposed Quantity: 1 Electronic Signature(s) Signed: 01/06/2023 4:06:11 PM By: Fredirick Maudlin MD FACS Previous Signature: 01/06/2023 3:23:10 PM Version By: Adline Peals Entered By: Fredirick Maudlin on 01/06/2023 16:06:11

## 2023-02-12 DIAGNOSIS — E785 Hyperlipidemia, unspecified: Secondary | ICD-10-CM | POA: Diagnosis not present

## 2023-02-12 DIAGNOSIS — E039 Hypothyroidism, unspecified: Secondary | ICD-10-CM | POA: Diagnosis not present

## 2023-03-09 ENCOUNTER — Other Ambulatory Visit: Payer: Self-pay | Admitting: Internal Medicine

## 2023-03-09 DIAGNOSIS — Z1231 Encounter for screening mammogram for malignant neoplasm of breast: Secondary | ICD-10-CM

## 2023-03-16 DIAGNOSIS — S233XXA Sprain of ligaments of thoracic spine, initial encounter: Secondary | ICD-10-CM | POA: Diagnosis not present

## 2023-03-16 DIAGNOSIS — M9905 Segmental and somatic dysfunction of pelvic region: Secondary | ICD-10-CM | POA: Diagnosis not present

## 2023-03-16 DIAGNOSIS — M531 Cervicobrachial syndrome: Secondary | ICD-10-CM | POA: Diagnosis not present

## 2023-03-16 DIAGNOSIS — M9902 Segmental and somatic dysfunction of thoracic region: Secondary | ICD-10-CM | POA: Diagnosis not present

## 2023-03-16 DIAGNOSIS — M9903 Segmental and somatic dysfunction of lumbar region: Secondary | ICD-10-CM | POA: Diagnosis not present

## 2023-03-16 DIAGNOSIS — M9901 Segmental and somatic dysfunction of cervical region: Secondary | ICD-10-CM | POA: Diagnosis not present

## 2023-03-16 DIAGNOSIS — S338XXA Sprain of other parts of lumbar spine and pelvis, initial encounter: Secondary | ICD-10-CM | POA: Diagnosis not present

## 2023-03-16 DIAGNOSIS — M9904 Segmental and somatic dysfunction of sacral region: Secondary | ICD-10-CM | POA: Diagnosis not present

## 2023-03-18 DIAGNOSIS — S338XXA Sprain of other parts of lumbar spine and pelvis, initial encounter: Secondary | ICD-10-CM | POA: Diagnosis not present

## 2023-03-18 DIAGNOSIS — M9904 Segmental and somatic dysfunction of sacral region: Secondary | ICD-10-CM | POA: Diagnosis not present

## 2023-03-18 DIAGNOSIS — M9902 Segmental and somatic dysfunction of thoracic region: Secondary | ICD-10-CM | POA: Diagnosis not present

## 2023-03-18 DIAGNOSIS — M9905 Segmental and somatic dysfunction of pelvic region: Secondary | ICD-10-CM | POA: Diagnosis not present

## 2023-03-18 DIAGNOSIS — M531 Cervicobrachial syndrome: Secondary | ICD-10-CM | POA: Diagnosis not present

## 2023-03-18 DIAGNOSIS — S233XXA Sprain of ligaments of thoracic spine, initial encounter: Secondary | ICD-10-CM | POA: Diagnosis not present

## 2023-03-18 DIAGNOSIS — M9903 Segmental and somatic dysfunction of lumbar region: Secondary | ICD-10-CM | POA: Diagnosis not present

## 2023-03-18 DIAGNOSIS — M9901 Segmental and somatic dysfunction of cervical region: Secondary | ICD-10-CM | POA: Diagnosis not present

## 2023-03-23 DIAGNOSIS — D3132 Benign neoplasm of left choroid: Secondary | ICD-10-CM | POA: Diagnosis not present

## 2023-03-23 DIAGNOSIS — B078 Other viral warts: Secondary | ICD-10-CM | POA: Diagnosis not present

## 2023-03-23 DIAGNOSIS — H43812 Vitreous degeneration, left eye: Secondary | ICD-10-CM | POA: Diagnosis not present

## 2023-03-23 DIAGNOSIS — E119 Type 2 diabetes mellitus without complications: Secondary | ICD-10-CM | POA: Diagnosis not present

## 2023-03-23 DIAGNOSIS — D225 Melanocytic nevi of trunk: Secondary | ICD-10-CM | POA: Diagnosis not present

## 2023-03-23 DIAGNOSIS — Z961 Presence of intraocular lens: Secondary | ICD-10-CM | POA: Diagnosis not present

## 2023-03-24 DIAGNOSIS — M531 Cervicobrachial syndrome: Secondary | ICD-10-CM | POA: Diagnosis not present

## 2023-03-24 DIAGNOSIS — M9903 Segmental and somatic dysfunction of lumbar region: Secondary | ICD-10-CM | POA: Diagnosis not present

## 2023-03-24 DIAGNOSIS — M9904 Segmental and somatic dysfunction of sacral region: Secondary | ICD-10-CM | POA: Diagnosis not present

## 2023-03-24 DIAGNOSIS — M9902 Segmental and somatic dysfunction of thoracic region: Secondary | ICD-10-CM | POA: Diagnosis not present

## 2023-03-24 DIAGNOSIS — M9901 Segmental and somatic dysfunction of cervical region: Secondary | ICD-10-CM | POA: Diagnosis not present

## 2023-03-24 DIAGNOSIS — S233XXA Sprain of ligaments of thoracic spine, initial encounter: Secondary | ICD-10-CM | POA: Diagnosis not present

## 2023-03-24 DIAGNOSIS — M9905 Segmental and somatic dysfunction of pelvic region: Secondary | ICD-10-CM | POA: Diagnosis not present

## 2023-03-24 DIAGNOSIS — S338XXA Sprain of other parts of lumbar spine and pelvis, initial encounter: Secondary | ICD-10-CM | POA: Diagnosis not present

## 2023-03-31 DIAGNOSIS — M531 Cervicobrachial syndrome: Secondary | ICD-10-CM | POA: Diagnosis not present

## 2023-03-31 DIAGNOSIS — S233XXA Sprain of ligaments of thoracic spine, initial encounter: Secondary | ICD-10-CM | POA: Diagnosis not present

## 2023-03-31 DIAGNOSIS — S338XXA Sprain of other parts of lumbar spine and pelvis, initial encounter: Secondary | ICD-10-CM | POA: Diagnosis not present

## 2023-03-31 DIAGNOSIS — M9904 Segmental and somatic dysfunction of sacral region: Secondary | ICD-10-CM | POA: Diagnosis not present

## 2023-03-31 DIAGNOSIS — M9905 Segmental and somatic dysfunction of pelvic region: Secondary | ICD-10-CM | POA: Diagnosis not present

## 2023-03-31 DIAGNOSIS — M9901 Segmental and somatic dysfunction of cervical region: Secondary | ICD-10-CM | POA: Diagnosis not present

## 2023-03-31 DIAGNOSIS — M9903 Segmental and somatic dysfunction of lumbar region: Secondary | ICD-10-CM | POA: Diagnosis not present

## 2023-03-31 DIAGNOSIS — M9902 Segmental and somatic dysfunction of thoracic region: Secondary | ICD-10-CM | POA: Diagnosis not present

## 2023-04-21 DIAGNOSIS — M9904 Segmental and somatic dysfunction of sacral region: Secondary | ICD-10-CM | POA: Diagnosis not present

## 2023-04-21 DIAGNOSIS — M9903 Segmental and somatic dysfunction of lumbar region: Secondary | ICD-10-CM | POA: Diagnosis not present

## 2023-04-21 DIAGNOSIS — S338XXA Sprain of other parts of lumbar spine and pelvis, initial encounter: Secondary | ICD-10-CM | POA: Diagnosis not present

## 2023-04-21 DIAGNOSIS — S233XXA Sprain of ligaments of thoracic spine, initial encounter: Secondary | ICD-10-CM | POA: Diagnosis not present

## 2023-04-21 DIAGNOSIS — M531 Cervicobrachial syndrome: Secondary | ICD-10-CM | POA: Diagnosis not present

## 2023-04-21 DIAGNOSIS — M9901 Segmental and somatic dysfunction of cervical region: Secondary | ICD-10-CM | POA: Diagnosis not present

## 2023-04-21 DIAGNOSIS — M9905 Segmental and somatic dysfunction of pelvic region: Secondary | ICD-10-CM | POA: Diagnosis not present

## 2023-04-21 DIAGNOSIS — M9902 Segmental and somatic dysfunction of thoracic region: Secondary | ICD-10-CM | POA: Diagnosis not present

## 2023-04-22 ENCOUNTER — Ambulatory Visit
Admission: RE | Admit: 2023-04-22 | Discharge: 2023-04-22 | Disposition: A | Discharge status: Home / Self Care | Payer: Medicare Other | Source: Ambulatory Visit | Attending: Internal Medicine | Admitting: Internal Medicine

## 2023-04-22 ENCOUNTER — Other Ambulatory Visit: Payer: Self-pay | Admitting: Internal Medicine

## 2023-04-22 DIAGNOSIS — Z1231 Encounter for screening mammogram for malignant neoplasm of breast: Secondary | ICD-10-CM

## 2023-04-22 DIAGNOSIS — N6452 Nipple discharge: Secondary | ICD-10-CM

## 2023-06-12 ENCOUNTER — Ambulatory Visit
Admission: RE | Admit: 2023-06-12 | Discharge: 2023-06-12 | Disposition: A | Discharge status: Home / Self Care | Payer: 59 | Source: Ambulatory Visit | Attending: Internal Medicine | Admitting: Internal Medicine

## 2023-06-12 ENCOUNTER — Other Ambulatory Visit: Payer: Self-pay | Admitting: Internal Medicine

## 2023-06-12 DIAGNOSIS — N6452 Nipple discharge: Secondary | ICD-10-CM | POA: Diagnosis not present

## 2023-08-31 DIAGNOSIS — I83009 Varicose veins of unspecified lower extremity with ulcer of unspecified site: Secondary | ICD-10-CM | POA: Diagnosis not present

## 2023-08-31 DIAGNOSIS — M25551 Pain in right hip: Secondary | ICD-10-CM | POA: Diagnosis not present

## 2023-08-31 DIAGNOSIS — I10 Essential (primary) hypertension: Secondary | ICD-10-CM | POA: Diagnosis not present

## 2023-08-31 DIAGNOSIS — L98499 Non-pressure chronic ulcer of skin of other sites with unspecified severity: Secondary | ICD-10-CM | POA: Diagnosis not present

## 2023-08-31 DIAGNOSIS — Z299 Encounter for prophylactic measures, unspecified: Secondary | ICD-10-CM | POA: Diagnosis not present

## 2023-11-18 DIAGNOSIS — S233XXA Sprain of ligaments of thoracic spine, initial encounter: Secondary | ICD-10-CM | POA: Diagnosis not present

## 2023-11-18 DIAGNOSIS — M9905 Segmental and somatic dysfunction of pelvic region: Secondary | ICD-10-CM | POA: Diagnosis not present

## 2023-11-18 DIAGNOSIS — S338XXA Sprain of other parts of lumbar spine and pelvis, initial encounter: Secondary | ICD-10-CM | POA: Diagnosis not present

## 2023-11-18 DIAGNOSIS — M9903 Segmental and somatic dysfunction of lumbar region: Secondary | ICD-10-CM | POA: Diagnosis not present

## 2023-11-18 DIAGNOSIS — M9902 Segmental and somatic dysfunction of thoracic region: Secondary | ICD-10-CM | POA: Diagnosis not present

## 2023-11-18 DIAGNOSIS — M531 Cervicobrachial syndrome: Secondary | ICD-10-CM | POA: Diagnosis not present

## 2023-11-18 DIAGNOSIS — M9904 Segmental and somatic dysfunction of sacral region: Secondary | ICD-10-CM | POA: Diagnosis not present

## 2023-11-18 DIAGNOSIS — M9901 Segmental and somatic dysfunction of cervical region: Secondary | ICD-10-CM | POA: Diagnosis not present

## 2023-12-02 DIAGNOSIS — Z1331 Encounter for screening for depression: Secondary | ICD-10-CM | POA: Diagnosis not present

## 2023-12-02 DIAGNOSIS — E1140 Type 2 diabetes mellitus with diabetic neuropathy, unspecified: Secondary | ICD-10-CM | POA: Diagnosis not present

## 2023-12-02 DIAGNOSIS — E7800 Pure hypercholesterolemia, unspecified: Secondary | ICD-10-CM | POA: Diagnosis not present

## 2023-12-02 DIAGNOSIS — R5383 Other fatigue: Secondary | ICD-10-CM | POA: Diagnosis not present

## 2023-12-02 DIAGNOSIS — Z299 Encounter for prophylactic measures, unspecified: Secondary | ICD-10-CM | POA: Diagnosis not present

## 2023-12-02 DIAGNOSIS — E559 Vitamin D deficiency, unspecified: Secondary | ICD-10-CM | POA: Diagnosis not present

## 2023-12-02 DIAGNOSIS — Z79899 Other long term (current) drug therapy: Secondary | ICD-10-CM | POA: Diagnosis not present

## 2023-12-02 DIAGNOSIS — Z0000 Encounter for general adult medical examination without abnormal findings: Secondary | ICD-10-CM | POA: Diagnosis not present

## 2023-12-02 DIAGNOSIS — Z1339 Encounter for screening examination for other mental health and behavioral disorders: Secondary | ICD-10-CM | POA: Diagnosis not present

## 2023-12-02 DIAGNOSIS — Z7189 Other specified counseling: Secondary | ICD-10-CM | POA: Diagnosis not present

## 2023-12-29 DIAGNOSIS — Z13820 Encounter for screening for osteoporosis: Secondary | ICD-10-CM | POA: Diagnosis not present

## 2023-12-30 DIAGNOSIS — Z6831 Body mass index (BMI) 31.0-31.9, adult: Secondary | ICD-10-CM | POA: Diagnosis not present

## 2023-12-30 DIAGNOSIS — I10 Essential (primary) hypertension: Secondary | ICD-10-CM | POA: Diagnosis not present

## 2023-12-30 DIAGNOSIS — Z299 Encounter for prophylactic measures, unspecified: Secondary | ICD-10-CM | POA: Diagnosis not present

## 2023-12-30 DIAGNOSIS — M25551 Pain in right hip: Secondary | ICD-10-CM | POA: Diagnosis not present

## 2023-12-31 DIAGNOSIS — M25551 Pain in right hip: Secondary | ICD-10-CM | POA: Diagnosis not present

## 2024-03-09 DIAGNOSIS — H9201 Otalgia, right ear: Secondary | ICD-10-CM | POA: Diagnosis not present

## 2024-04-20 DIAGNOSIS — Z131 Encounter for screening for diabetes mellitus: Secondary | ICD-10-CM | POA: Diagnosis not present

## 2024-04-20 DIAGNOSIS — T564X4A Toxic effect of copper and its compounds, undetermined, initial encounter: Secondary | ICD-10-CM | POA: Diagnosis not present

## 2024-04-20 DIAGNOSIS — D519 Vitamin B12 deficiency anemia, unspecified: Secondary | ICD-10-CM | POA: Diagnosis not present

## 2024-04-20 DIAGNOSIS — D509 Iron deficiency anemia, unspecified: Secondary | ICD-10-CM | POA: Diagnosis not present

## 2024-04-20 DIAGNOSIS — E038 Other specified hypothyroidism: Secondary | ICD-10-CM | POA: Diagnosis not present

## 2024-04-20 DIAGNOSIS — E539 Vitamin B deficiency, unspecified: Secondary | ICD-10-CM | POA: Diagnosis not present

## 2024-04-20 DIAGNOSIS — E559 Vitamin D deficiency, unspecified: Secondary | ICD-10-CM | POA: Diagnosis not present

## 2024-04-20 DIAGNOSIS — E279 Disorder of adrenal gland, unspecified: Secondary | ICD-10-CM | POA: Diagnosis not present

## 2024-04-20 DIAGNOSIS — D649 Anemia, unspecified: Secondary | ICD-10-CM | POA: Diagnosis not present

## 2024-04-20 DIAGNOSIS — E59 Dietary selenium deficiency: Secondary | ICD-10-CM | POA: Diagnosis not present

## 2024-06-09 DIAGNOSIS — E039 Hypothyroidism, unspecified: Secondary | ICD-10-CM | POA: Diagnosis not present

## 2024-06-15 DIAGNOSIS — E119 Type 2 diabetes mellitus without complications: Secondary | ICD-10-CM | POA: Diagnosis not present

## 2024-06-15 DIAGNOSIS — H43812 Vitreous degeneration, left eye: Secondary | ICD-10-CM | POA: Diagnosis not present

## 2024-06-15 DIAGNOSIS — Z961 Presence of intraocular lens: Secondary | ICD-10-CM | POA: Diagnosis not present

## 2024-06-15 DIAGNOSIS — D3132 Benign neoplasm of left choroid: Secondary | ICD-10-CM | POA: Diagnosis not present

## 2024-06-15 DIAGNOSIS — H26491 Other secondary cataract, right eye: Secondary | ICD-10-CM | POA: Diagnosis not present

## 2024-06-16 DIAGNOSIS — E039 Hypothyroidism, unspecified: Secondary | ICD-10-CM | POA: Diagnosis not present

## 2024-09-20 DIAGNOSIS — Z299 Encounter for prophylactic measures, unspecified: Secondary | ICD-10-CM | POA: Diagnosis not present

## 2024-09-20 DIAGNOSIS — E1140 Type 2 diabetes mellitus with diabetic neuropathy, unspecified: Secondary | ICD-10-CM | POA: Diagnosis not present

## 2024-09-20 DIAGNOSIS — R52 Pain, unspecified: Secondary | ICD-10-CM | POA: Diagnosis not present

## 2024-09-20 DIAGNOSIS — M25551 Pain in right hip: Secondary | ICD-10-CM | POA: Diagnosis not present

## 2024-09-20 DIAGNOSIS — L98499 Non-pressure chronic ulcer of skin of other sites with unspecified severity: Secondary | ICD-10-CM | POA: Diagnosis not present

## 2024-09-20 DIAGNOSIS — I10 Essential (primary) hypertension: Secondary | ICD-10-CM | POA: Diagnosis not present

## 2024-09-28 DIAGNOSIS — L98499 Non-pressure chronic ulcer of skin of other sites with unspecified severity: Secondary | ICD-10-CM | POA: Diagnosis not present

## 2024-09-28 DIAGNOSIS — M25551 Pain in right hip: Secondary | ICD-10-CM | POA: Diagnosis not present

## 2024-09-28 DIAGNOSIS — I10 Essential (primary) hypertension: Secondary | ICD-10-CM | POA: Diagnosis not present

## 2024-09-28 DIAGNOSIS — E1140 Type 2 diabetes mellitus with diabetic neuropathy, unspecified: Secondary | ICD-10-CM | POA: Diagnosis not present

## 2024-09-28 DIAGNOSIS — Z299 Encounter for prophylactic measures, unspecified: Secondary | ICD-10-CM | POA: Diagnosis not present

## 2024-10-25 DIAGNOSIS — I10 Essential (primary) hypertension: Secondary | ICD-10-CM | POA: Diagnosis not present

## 2024-10-25 DIAGNOSIS — L97909 Non-pressure chronic ulcer of unspecified part of unspecified lower leg with unspecified severity: Secondary | ICD-10-CM | POA: Diagnosis not present

## 2024-10-25 DIAGNOSIS — Z299 Encounter for prophylactic measures, unspecified: Secondary | ICD-10-CM | POA: Diagnosis not present

## 2024-10-25 DIAGNOSIS — I83009 Varicose veins of unspecified lower extremity with ulcer of unspecified site: Secondary | ICD-10-CM | POA: Diagnosis not present

## 2024-11-23 DIAGNOSIS — Z7189 Other specified counseling: Secondary | ICD-10-CM | POA: Diagnosis not present

## 2024-11-23 DIAGNOSIS — R52 Pain, unspecified: Secondary | ICD-10-CM | POA: Diagnosis not present

## 2024-11-23 DIAGNOSIS — I10 Essential (primary) hypertension: Secondary | ICD-10-CM | POA: Diagnosis not present

## 2024-11-23 DIAGNOSIS — Z1331 Encounter for screening for depression: Secondary | ICD-10-CM | POA: Diagnosis not present

## 2024-11-23 DIAGNOSIS — E039 Hypothyroidism, unspecified: Secondary | ICD-10-CM | POA: Diagnosis not present

## 2024-11-23 DIAGNOSIS — Z1339 Encounter for screening examination for other mental health and behavioral disorders: Secondary | ICD-10-CM | POA: Diagnosis not present

## 2024-11-23 DIAGNOSIS — Z299 Encounter for prophylactic measures, unspecified: Secondary | ICD-10-CM | POA: Diagnosis not present

## 2024-11-23 DIAGNOSIS — Z79899 Other long term (current) drug therapy: Secondary | ICD-10-CM | POA: Diagnosis not present

## 2024-11-23 DIAGNOSIS — M8580 Other specified disorders of bone density and structure, unspecified site: Secondary | ICD-10-CM | POA: Diagnosis not present

## 2024-11-23 DIAGNOSIS — Z0000 Encounter for general adult medical examination without abnormal findings: Secondary | ICD-10-CM | POA: Diagnosis not present

## 2024-11-23 DIAGNOSIS — E7800 Pure hypercholesterolemia, unspecified: Secondary | ICD-10-CM | POA: Diagnosis not present

## 2024-11-23 DIAGNOSIS — E1140 Type 2 diabetes mellitus with diabetic neuropathy, unspecified: Secondary | ICD-10-CM | POA: Diagnosis not present

## 2024-11-23 DIAGNOSIS — R5383 Other fatigue: Secondary | ICD-10-CM | POA: Diagnosis not present
